# Patient Record
Sex: Male | Born: 1995 | Race: Black or African American | Hispanic: No | Marital: Single | Smoking: Never smoker
Health system: Southern US, Community
[De-identification: ages and names within clinical notes are randomized; demographics above are authoritative.]

## PROBLEM LIST (undated history)

## (undated) DIAGNOSIS — E119 Type 2 diabetes mellitus without complications: Secondary | ICD-10-CM

## (undated) DIAGNOSIS — E1110 Type 2 diabetes mellitus with ketoacidosis without coma: Secondary | ICD-10-CM

## (undated) HISTORY — PX: ADENOIDECTOMY: SUR15

---

## 2019-01-16 ENCOUNTER — Encounter (HOSPITAL_COMMUNITY): Payer: Self-pay

## 2019-01-16 ENCOUNTER — Other Ambulatory Visit: Payer: Self-pay

## 2019-01-16 ENCOUNTER — Emergency Department (HOSPITAL_COMMUNITY)

## 2019-01-16 ENCOUNTER — Emergency Department (HOSPITAL_COMMUNITY)
Admission: EM | Admit: 2019-01-16 | Discharge: 2019-01-16 | Disposition: A | Discharge status: Home / Self Care | Attending: Emergency Medicine | Admitting: Emergency Medicine

## 2019-01-16 DIAGNOSIS — E1165 Type 2 diabetes mellitus with hyperglycemia: Secondary | ICD-10-CM | POA: Diagnosis not present

## 2019-01-16 DIAGNOSIS — R109 Unspecified abdominal pain: Secondary | ICD-10-CM | POA: Diagnosis not present

## 2019-01-16 DIAGNOSIS — E1010 Type 1 diabetes mellitus with ketoacidosis without coma: Secondary | ICD-10-CM | POA: Diagnosis not present

## 2019-01-16 DIAGNOSIS — R0602 Shortness of breath: Secondary | ICD-10-CM | POA: Diagnosis not present

## 2019-01-16 DIAGNOSIS — R1110 Vomiting, unspecified: Secondary | ICD-10-CM | POA: Insufficient documentation

## 2019-01-16 DIAGNOSIS — Z20828 Contact with and (suspected) exposure to other viral communicable diseases: Secondary | ICD-10-CM | POA: Diagnosis not present

## 2019-01-16 DIAGNOSIS — R509 Fever, unspecified: Secondary | ICD-10-CM | POA: Diagnosis present

## 2019-01-16 DIAGNOSIS — F1210 Cannabis abuse, uncomplicated: Secondary | ICD-10-CM | POA: Insufficient documentation

## 2019-01-16 DIAGNOSIS — R5383 Other fatigue: Secondary | ICD-10-CM | POA: Diagnosis not present

## 2019-01-16 HISTORY — DX: Type 2 diabetes mellitus with ketoacidosis without coma: E11.10

## 2019-01-16 HISTORY — DX: Type 2 diabetes mellitus without complications: E11.9

## 2019-01-16 LAB — URINALYSIS, ROUTINE W REFLEX MICROSCOPIC
Bilirubin Urine: NEGATIVE
Glucose, UA: >=500 mg/dL — AB
Hgb urine dipstick: NEGATIVE
Ketones, ur: 20 mg/dL — AB
Leukocytes,Ua: NEGATIVE
Nitrite: NEGATIVE
Protein, ur: NEGATIVE mg/dL
Specific Gravity, Urine: 1.025 (ref 1.005–1.030)
pH: 6 (ref 5.0–8.0)

## 2019-01-16 LAB — CBC
HCT: 50.8 % (ref 39.0–52.0)
Hemoglobin: 18.2 g/dL — ABNORMAL HIGH (ref 13.0–17.0)
MCH: 31.8 pg (ref 26.0–34.0)
MCHC: 35.8 g/dL (ref 30.0–36.0)
MCV: 88.8 fL (ref 80.0–100.0)
Platelets: 328 10*3/uL (ref 150–400)
RBC: 5.72 MIL/uL (ref 4.22–5.81)
RDW: 11.7 % (ref 11.5–15.5)
WBC: 10.4 10*3/uL (ref 4.0–10.5)
nRBC: 0 % (ref 0.0–0.2)

## 2019-01-16 LAB — CBG MONITORING, ED
Glucose-Capillary: 491 mg/dL — ABNORMAL HIGH (ref 70–99)
Glucose-Capillary: 445 mg/dL — ABNORMAL HIGH (ref 70–99)
Glucose-Capillary: 247 mg/dL — ABNORMAL HIGH (ref 70–99)
Glucose-Capillary: 147 mg/dL — ABNORMAL HIGH (ref 70–99)

## 2019-01-16 LAB — BLOOD GAS, VENOUS
Acid-base deficit: 8.5 mmol/L — ABNORMAL HIGH (ref 0.0–2.0)
Bicarbonate: 17.6 mmol/L — ABNORMAL LOW (ref 20.0–28.0)
O2 Saturation: 42.3 %
Patient temperature: 98.6
pCO2, Ven: 39.1 mmHg — ABNORMAL LOW (ref 44.0–60.0)
pH, Ven: 7.277 (ref 7.250–7.430)

## 2019-01-16 LAB — SARS CORONAVIRUS 2 BY RT PCR (HOSPITAL ORDER, PERFORMED IN ~~LOC~~ HOSPITAL LAB): SARS Coronavirus 2: NEGATIVE

## 2019-01-16 LAB — BASIC METABOLIC PANEL
Anion gap: 10 (ref 5–15)
BUN: 15 mg/dL (ref 6–20)
CO2: 16 mmol/L — ABNORMAL LOW (ref 22–32)
Calcium: 8.3 mg/dL — ABNORMAL LOW (ref 8.9–10.3)
Chloride: 108 mmol/L (ref 98–111)
Creatinine, Ser: 0.83 mg/dL (ref 0.61–1.24)
GFR calc Af Amer: >60 mL/min (ref >60)
GFR calc non Af Amer: >60 mL/min (ref >60)
Glucose, Bld: 146 mg/dL — ABNORMAL HIGH (ref 70–99)
Potassium: 3.3 mmol/L — ABNORMAL LOW (ref 3.5–5.1)
Sodium: 134 mmol/L — ABNORMAL LOW (ref 135–145)

## 2019-01-16 MED ORDER — SODIUM CHLORIDE 0.9 % IV BOLUS
1000.0000 mL | Freq: Once | INTRAVENOUS | Status: AC
  Administered 2019-01-16: 1000 mL via INTRAVENOUS

## 2019-01-16 MED ORDER — INSULIN REGULAR(HUMAN) IN NACL 100-0.9 UT/100ML-% IV SOLN
INTRAVENOUS | Status: DC
  Administered 2019-01-16: 3.9 [IU]/h via INTRAVENOUS
  Filled 2019-01-16: qty 100

## 2019-01-16 MED ORDER — ONDANSETRON HCL 4 MG PO TABS: 4.0000 mg | ORAL_TABLET | Freq: Three times a day (TID) | ORAL | 0 refills | Status: DC | PRN

## 2019-01-16 MED ORDER — ONDANSETRON HCL 4 MG/2ML IJ SOLN
4.0000 mg | Freq: Once | INTRAMUSCULAR | Status: AC
  Administered 2019-01-16: 4 mg via INTRAVENOUS
  Filled 2019-01-16: qty 2

## 2019-01-16 MED ORDER — SODIUM CHLORIDE 0.9 % IV SOLN
INTRAVENOUS | Status: DC
  Administered 2019-01-16: 13:00:00 via INTRAVENOUS

## 2019-01-16 NOTE — ED Provider Notes (Signed)
[facility]-EMERGENCY DEPT Provider Note   CSN: [id number] Arrival date & time: 01/16/19  1121     History   Chief Complaint Chief Complaint  Patient presents with  . Fever  . Emesis  . Abdominal Pain  . Hyperglycemia    HPI Nathan Allen is a 23 y.o. male.     The history is provided by the patient and medical records. No language interpreter was used.  Fever Associated symptoms: vomiting   Emesis Associated symptoms: abdominal pain and fever   Abdominal Pain Associated symptoms: fever and vomiting   Hyperglycemia Associated symptoms: abdominal pain, fever and vomiting      23 year old male presenting with concern for DKA.  Patient is type I diabetic, for the past 3 to 4 days he endorsed persistent nausea, vomiting nonbloody nonbilious content, having generalized fatigue, subjective fever, as well as having shortness of breath when he walks.  Symptoms moderate in severity, he is unable to keep anything down and unable to use his diabetic medication.  He does not complain of any significant runny nose sneezing or coughing.  He denies body aches, or dysuria.  He denies any recent sick contact or any recent travel.  He has had DKA in the past requiring hospital admission.  Past Medical History:  Diagnosis Date  . Diabetes mellitus without complication (HCC)   . DKA (diabetic ketoacidoses) (HCC)     There are no active problems to display for this patient.   Past Surgical History:  Procedure Laterality Date  . ADENOIDECTOMY          Home Medications    Prior to Admission medications   Not on File    Family History Family History  Problem Relation Age of Onset  . Healthy Mother   . Healthy Father     Social History Social History   Tobacco Use  . Smoking status: Never Smoker  . Smokeless tobacco: Never Used  Substance Use Topics  . Alcohol use: Never    Frequency: Never  . Drug use: Yes    Types: Marijuana     Allergies    Patient has no known allergies.   Review of Systems Review of Systems  Constitutional: Positive for fever.  Gastrointestinal: Positive for abdominal pain and vomiting.  All other systems reviewed and are negative.    Physical Exam Updated Vital Signs BP 120/89 (BP Location: Right Arm)   Pulse 100   Temp 97.8 F (36.6 C) (Oral)   Resp 16   Ht 5\' 10"  (1.778 m)   Wt 54.4 kg   SpO2 100%   BMI 17.22 kg/m   Physical Exam Vitals signs and nursing note reviewed.  Constitutional:      General: He is not in acute distress.    Appearance: He is well-developed.  HENT:     Head: Atraumatic.  Eyes:     Conjunctiva/sclera: Conjunctivae normal.  Neck:     Musculoskeletal: Neck supple.  Cardiovascular:     Rate and Rhythm: Tachycardia present.     Heart sounds: Normal heart sounds.  Pulmonary:     Effort: Pulmonary effort is normal.     Breath sounds: Normal breath sounds. No wheezing, rhonchi or rales.  Abdominal:     General: Abdomen is flat. Bowel sounds are normal.     Palpations: Abdomen is soft.     Tenderness: There is no abdominal tenderness.  Skin:    Findings: No rash.  Neurological:     Mental  Status: He is alert and oriented to person, place, and time.  Psychiatric:        Mood and Affect: Mood normal.      ED Treatments / Results  Labs (all labs ordered are listed, but only abnormal results are displayed) Labs Reviewed  CBC - Abnormal; Notable for the following components:      Result Value   Hemoglobin 18.2 (*)    All other components within normal limits  URINALYSIS, ROUTINE W REFLEX MICROSCOPIC - Abnormal; Notable for the following components:   Color, Urine STRAW (*)    Glucose, UA >=500 (*)    Ketones, ur 20 (*)    Bacteria, UA RARE (*)    All other components within normal limits  BLOOD GAS, VENOUS - Abnormal; Notable for the following components:   pCO2, Ven 39.1 (*)    Bicarbonate 17.6 (*)    Acid-base deficit 8.5 (*)    All other  components within normal limits  BASIC METABOLIC PANEL - Abnormal; Notable for the following components:   Sodium 134 (*)    Potassium 3.3 (*)    CO2 16 (*)    Glucose, Bld 146 (*)    Calcium 8.3 (*)    All other components within normal limits  CBG MONITORING, ED - Abnormal; Notable for the following components:   Glucose-Capillary 491 (*)    All other components within normal limits  CBG MONITORING, ED - Abnormal; Notable for the following components:   Glucose-Capillary 445 (*)    All other components within normal limits  CBG MONITORING, ED - Abnormal; Notable for the following components:   Glucose-Capillary 247 (*)    All other components within normal limits  CBG MONITORING, ED - Abnormal; Notable for the following components:   Glucose-Capillary 147 (*)    All other components within normal limits  SARS CORONAVIRUS 2 (HOSPITAL ORDER, PERFORMED IN [facility])    EKG None  Radiology Dg Chest Portable 1 View  Result Date: 01/16/2019 CLINICAL DATA:  Fever, shortness of breath EXAM: PORTABLE CHEST 1 VIEW COMPARISON:  None. FINDINGS: The heart size and mediastinal contours are within normal limits. Both lungs are clear. The visualized skeletal structures are unremarkable. IMPRESSION: Normal chest x-ray. Electronically Signed   By: Duanne GuessNicholas  Plundo M.D.   On: 01/16/2019 13:38    Procedures .Critical Care Performed by: Fayrene Helperran, Delbert Vu, PA-C Authorized by: Fayrene Helperran, Derryl Uher, PA-C   Critical care provider statement:    Critical care time (minutes):  45   Critical care was time spent personally by me on the following activities:  Discussions with consultants, evaluation of patient's response to treatment, examination of patient, ordering and performing treatments and interventions, ordering and review of laboratory studies, ordering and review of radiographic studies, pulse oximetry, re-evaluation of patient's condition, obtaining history from patient or surrogate and review  of old charts   (including critical care time)  Medications Ordered in ED Medications  insulin regular, human (MYXREDLIN) 100 units/ 100 mL infusion (has no administration in time range)  sodium chloride 0.9 % bolus 1,000 mL (has no administration in time range)    And  sodium chloride 0.9 % bolus 1,000 mL (has no administration in time range)    And  0.9 %  sodium chloride infusion (has no administration in time range)  ondansetron (ZOFRAN) injection 4 mg (has no administration in time range)     Initial Impression / Assessment and Plan / ED Course  I have reviewed  the triage vital signs and the nursing notes.  Pertinent labs & imaging results that were available during my care of the patient were reviewed by me and considered in my medical decision making (see chart for details).        BP 106/80   Pulse (!) 114   Temp 97.8 F (36.6 C) (Oral)   Resp 18   Ht 5\' 10"  (1.778 m)   Wt 54.4 kg   SpO2 100%   BMI 17.22 kg/m    Final Clinical Impressions(s) / ED Diagnoses   Final diagnoses:  Diabetic ketoacidosis without coma associated with type 1 diabetes mellitus [facility])    ED Discharge Orders         Ordered    ondansetron (ZOFRAN) 4 MG tablet  Every 8 hours PRN     01/16/19 1734         12:21 PM Patient with history of type 1 diabetes here with persistent nausea vomiting and was found to be hyperglycemic with a CBG of 491.  Symptom concerning for DKA.  No COVID symptom however he does endorse fever and shortness of breath therefore will obtain portable chest x-ray as well as testing for COVID-19.  Work-up initiated.  Glucose stabilizer protocol initiated.  5:26 PM Unfortunately, BMP was obtained but it did not crossover therefore there was a delay in the result.  After patient received adequate fluid, and insulin, a BMP was subsequently obtained and did not shows any evidence of anion gap.  CBG now back to normal level, patient felt much better, able to eat and drink  and felt comfortable going home.  He does not have a fever here.  He did receive IV fluids for his tachycardia.  COVID-19 test is negative.  Patient is then to return promptly if his condition worsen   Domenic Moras, Hershal Coria 01/16/19 1734    Long, Wonda Olds, MD 01/16/19 [phone]

## 2019-01-16 NOTE — ED Triage Notes (Signed)
Patient c/o mild abdominal pain, emesis, fever, fatigue, SOB x 3 days.  Patient states any activity he feels tired and has SOB. Patient states he is type 1 diabetic and has not been able to eat due to N/V.

## 2019-05-08 ENCOUNTER — Other Ambulatory Visit: Payer: Self-pay

## 2019-05-08 ENCOUNTER — Encounter (HOSPITAL_COMMUNITY): Payer: Self-pay | Admitting: Emergency Medicine

## 2019-05-08 ENCOUNTER — Emergency Department (HOSPITAL_COMMUNITY)
Admission: EM | Admit: 2019-05-08 | Discharge: 2019-05-08 | Disposition: A | Discharge status: Home / Self Care | Attending: Emergency Medicine | Admitting: Emergency Medicine

## 2019-05-08 DIAGNOSIS — Z794 Long term (current) use of insulin: Secondary | ICD-10-CM | POA: Insufficient documentation

## 2019-05-08 DIAGNOSIS — E119 Type 2 diabetes mellitus without complications: Secondary | ICD-10-CM | POA: Insufficient documentation

## 2019-05-08 DIAGNOSIS — Z5321 Procedure and treatment not carried out due to patient leaving prior to being seen by health care provider: Secondary | ICD-10-CM | POA: Insufficient documentation

## 2019-05-08 LAB — CBC
HCT: 52.8 % — ABNORMAL HIGH (ref 39.0–52.0)
Hemoglobin: 18.3 g/dL — ABNORMAL HIGH (ref 13.0–17.0)
MCH: 32.3 pg (ref 26.0–34.0)
MCHC: 34.7 g/dL (ref 30.0–36.0)
MCV: 93.1 fL (ref 80.0–100.0)
Platelets: 385 10*3/uL (ref 150–400)
RBC: 5.67 MIL/uL (ref 4.22–5.81)
RDW: 11.4 % — ABNORMAL LOW (ref 11.5–15.5)
WBC: 23.5 10*3/uL — ABNORMAL HIGH (ref 4.0–10.5)
nRBC: 0 % (ref 0.0–0.2)

## 2019-05-08 LAB — COMPREHENSIVE METABOLIC PANEL
ALT: 22 U/L (ref 0–44)
AST: 29 U/L (ref 15–41)
Albumin: 5.2 g/dL — ABNORMAL HIGH (ref 3.5–5.0)
Alkaline Phosphatase: 65 U/L (ref 38–126)
Anion gap: 21 — ABNORMAL HIGH (ref 5–15)
BUN: 17 mg/dL (ref 6–20)
CO2: 10 mmol/L — ABNORMAL LOW (ref 22–32)
Calcium: 10.2 mg/dL (ref 8.9–10.3)
Chloride: 105 mmol/L (ref 98–111)
Creatinine, Ser: 1.71 mg/dL — ABNORMAL HIGH (ref 0.61–1.24)
GFR calc Af Amer: >60 mL/min (ref >60)
GFR calc non Af Amer: 56 mL/min — ABNORMAL LOW (ref >60)
Glucose, Bld: 163 mg/dL — ABNORMAL HIGH (ref 70–99)
Potassium: 4.6 mmol/L (ref 3.5–5.1)
Sodium: 136 mmol/L (ref 135–145)
Total Bilirubin: 1.8 mg/dL — ABNORMAL HIGH (ref 0.3–1.2)
Total Protein: 8.2 g/dL — ABNORMAL HIGH (ref 6.5–8.1)

## 2019-05-08 LAB — LIPASE, BLOOD: Lipase: 48 U/L (ref 11–51)

## 2019-05-08 LAB — CBG MONITORING, ED: Glucose-Capillary: 149 mg/dL — ABNORMAL HIGH (ref 70–99)

## 2019-05-08 MED ORDER — SODIUM CHLORIDE 0.9% FLUSH: 3.0000 mL | Freq: Once | INTRAVENOUS | Status: DC

## 2019-05-08 NOTE — ED Notes (Signed)
Pt called to recheck vitals. No response.  

## 2019-05-08 NOTE — ED Notes (Signed)
Last call for pt. No response and pt is not seen in the waiting room anymore.

## 2019-05-08 NOTE — ED Notes (Signed)
2x calling pt to recheck vitals. No response.  

## 2019-05-08 NOTE — ED Triage Notes (Signed)
Pt reports not being able to keep any food down, only water since yesterday. Believes it is a stomach bug or DKA. Reports taking insulin today.

## 2019-07-16 ENCOUNTER — Inpatient Hospital Stay (HOSPITAL_COMMUNITY)
Admission: EM | Admit: 2019-07-16 | Discharge: 2019-07-20 | DRG: 638 | Disposition: A | Discharge status: Home / Self Care | Attending: Internal Medicine | Admitting: Internal Medicine

## 2019-07-16 ENCOUNTER — Encounter (HOSPITAL_COMMUNITY): Payer: Self-pay

## 2019-07-16 ENCOUNTER — Other Ambulatory Visit: Payer: Self-pay

## 2019-07-16 ENCOUNTER — Emergency Department (HOSPITAL_COMMUNITY): Payer: Self-pay

## 2019-07-16 DIAGNOSIS — E1110 Type 2 diabetes mellitus with ketoacidosis without coma: Secondary | ICD-10-CM | POA: Diagnosis present

## 2019-07-16 DIAGNOSIS — Z91138 Patient's unintentional underdosing of medication regimen for other reason: Secondary | ICD-10-CM

## 2019-07-16 DIAGNOSIS — E871 Hypo-osmolality and hyponatremia: Secondary | ICD-10-CM | POA: Diagnosis present

## 2019-07-16 DIAGNOSIS — Z794 Long term (current) use of insulin: Secondary | ICD-10-CM

## 2019-07-16 DIAGNOSIS — R17 Unspecified jaundice: Secondary | ICD-10-CM | POA: Diagnosis present

## 2019-07-16 DIAGNOSIS — E860 Dehydration: Secondary | ICD-10-CM | POA: Diagnosis present

## 2019-07-16 DIAGNOSIS — E1010 Type 1 diabetes mellitus with ketoacidosis without coma: Secondary | ICD-10-CM | POA: Diagnosis not present

## 2019-07-16 DIAGNOSIS — D72825 Bandemia: Secondary | ICD-10-CM | POA: Diagnosis not present

## 2019-07-16 DIAGNOSIS — E876 Hypokalemia: Secondary | ICD-10-CM

## 2019-07-16 DIAGNOSIS — T383X6A Underdosing of insulin and oral hypoglycemic [antidiabetic] drugs, initial encounter: Secondary | ICD-10-CM | POA: Diagnosis not present

## 2019-07-16 DIAGNOSIS — Z20822 Contact with and (suspected) exposure to covid-19: Secondary | ICD-10-CM | POA: Diagnosis present

## 2019-07-16 DIAGNOSIS — D72829 Elevated white blood cell count, unspecified: Secondary | ICD-10-CM | POA: Diagnosis present

## 2019-07-16 DIAGNOSIS — N179 Acute kidney failure, unspecified: Secondary | ICD-10-CM | POA: Diagnosis present

## 2019-07-16 LAB — CBC WITH DIFFERENTIAL/PLATELET
Abs Immature Granulocytes: 0.52 10*3/uL — ABNORMAL HIGH (ref 0.00–0.07)
Basophils Absolute: 0.2 10*3/uL — ABNORMAL HIGH (ref 0.0–0.1)
Basophils Relative: 1 %
Eosinophils Absolute: 0 10*3/uL (ref 0.0–0.5)
Eosinophils Relative: 0 %
HCT: 52.6 % — ABNORMAL HIGH (ref 39.0–52.0)
Hemoglobin: 18.1 g/dL — ABNORMAL HIGH (ref 13.0–17.0)
Immature Granulocytes: 2 %
Lymphocytes Relative: 7 %
Lymphs Abs: 2.1 10*3/uL (ref 0.7–4.0)
MCH: 32 pg (ref 26.0–34.0)
MCHC: 34.4 g/dL (ref 30.0–36.0)
MCV: 93.1 fL (ref 80.0–100.0)
Monocytes Absolute: 3.2 10*3/uL — ABNORMAL HIGH (ref 0.1–1.0)
Monocytes Relative: 10 %
Neutro Abs: 25.9 10*3/uL — ABNORMAL HIGH (ref 1.7–7.7)
Neutrophils Relative %: 80 %
Platelets: 383 10*3/uL (ref 150–400)
RBC: 5.65 MIL/uL (ref 4.22–5.81)
RDW: 11.8 % (ref 11.5–15.5)
WBC: 31.9 10*3/uL — ABNORMAL HIGH (ref 4.0–10.5)
nRBC: 0 % (ref 0.0–0.2)

## 2019-07-16 LAB — BLOOD GAS, VENOUS
Bicarbonate: 4.7 mmol/L — ABNORMAL LOW (ref 20.0–28.0)
O2 Saturation: 86.1 %
Patient temperature: 98.6
pCO2, Ven: 24.8 mmHg — ABNORMAL LOW (ref 44.0–60.0)
pH, Ven: 6.91 — CL (ref 7.250–7.430)
pO2, Ven: 69.3 mmHg — ABNORMAL HIGH (ref 32.0–45.0)

## 2019-07-16 LAB — CBG MONITORING, ED
Glucose-Capillary: 420 mg/dL — ABNORMAL HIGH (ref 70–99)
Glucose-Capillary: 483 mg/dL — ABNORMAL HIGH (ref 70–99)
Glucose-Capillary: 369 mg/dL — ABNORMAL HIGH (ref 70–99)

## 2019-07-16 LAB — LACTIC ACID, PLASMA: Lactic Acid, Venous: 2.8 mmol/L (ref 0.5–1.9)

## 2019-07-16 LAB — COMPREHENSIVE METABOLIC PANEL
ALT: 21 U/L (ref 0–44)
AST: 15 U/L (ref 15–41)
Albumin: 5.4 g/dL — ABNORMAL HIGH (ref 3.5–5.0)
Alkaline Phosphatase: 89 U/L (ref 38–126)
BUN: 23 mg/dL — ABNORMAL HIGH (ref 6–20)
CO2: <7 mmol/L — ABNORMAL LOW (ref 22–32)
Calcium: 9.3 mg/dL (ref 8.9–10.3)
Chloride: 93 mmol/L — ABNORMAL LOW (ref 98–111)
Creatinine, Ser: 1.48 mg/dL — ABNORMAL HIGH (ref 0.61–1.24)
GFR calc Af Amer: >60 mL/min (ref >60)
GFR calc non Af Amer: >60 mL/min (ref >60)
Glucose, Bld: 462 mg/dL — ABNORMAL HIGH (ref 70–99)
Potassium: 4.4 mmol/L (ref 3.5–5.1)
Sodium: 127 mmol/L — ABNORMAL LOW (ref 135–145)
Total Bilirubin: 2.3 mg/dL — ABNORMAL HIGH (ref 0.3–1.2)
Total Protein: 9 g/dL — ABNORMAL HIGH (ref 6.5–8.1)

## 2019-07-16 LAB — BETA-HYDROXYBUTYRIC ACID: Beta-Hydroxybutyric Acid: >8 mmol/L — ABNORMAL HIGH (ref 0.05–0.27)

## 2019-07-16 LAB — RESPIRATORY PANEL BY RT PCR (FLU A&B, COVID)
Influenza A by PCR: NEGATIVE
Influenza B by PCR: NEGATIVE
SARS Coronavirus 2 by RT PCR: NEGATIVE

## 2019-07-16 MED ORDER — SODIUM BICARBONATE 8.4 % IV SOLN: 50.0000 meq | Freq: Once | INTRAVENOUS | Status: AC

## 2019-07-16 MED ORDER — SODIUM BICARBONATE 8.4 % IV SOLN
INTRAVENOUS | Status: AC
  Administered 2019-07-16: 50 meq via INTRAVENOUS
  Filled 2019-07-16: qty 50

## 2019-07-16 MED ORDER — INSULIN REGULAR(HUMAN) IN NACL 100-0.9 UT/100ML-% IV SOLN
INTRAVENOUS | Status: DC
  Administered 2019-07-16: 22:00:00 6.5 [IU]/h via INTRAVENOUS
  Filled 2019-07-16: qty 100

## 2019-07-16 MED ORDER — SODIUM CHLORIDE 0.9 % IV BOLUS
1000.0000 mL | Freq: Once | INTRAVENOUS | Status: AC
  Administered 2019-07-16: 22:00:00 1000 mL via INTRAVENOUS

## 2019-07-16 MED ORDER — SODIUM CHLORIDE 0.9 % IV BOLUS
1000.0000 mL | Freq: Once | INTRAVENOUS | Status: AC
  Administered 2019-07-16: 1000 mL via INTRAVENOUS

## 2019-07-16 MED ORDER — DEXTROSE-NACL 5-0.45 % IV SOLN: INTRAVENOUS | Status: DC

## 2019-07-16 MED ORDER — POTASSIUM CHLORIDE 10 MEQ/100ML IV SOLN
10.0000 meq | INTRAVENOUS | Status: AC
  Administered 2019-07-16 (×2): 10 meq via INTRAVENOUS
  Filled 2019-07-16 (×2): qty 100

## 2019-07-16 MED ORDER — DEXTROSE 50 % IV SOLN: 0.0000 mL | INTRAVENOUS | Status: DC | PRN

## 2019-07-16 MED ORDER — SODIUM CHLORIDE 0.9 % IV SOLN: INTRAVENOUS | Status: DC

## 2019-07-16 NOTE — ED Notes (Signed)
Date and time results received: 07/16/19 11:46 PM  (use smartphrase ".now" to insert current time)  Test: Lactic Acid Critical Value: 2.8  Name of Provider Notified: Mikeal Hawthorne  Orders Received? Or Actions Taken?: Actions Taken: MD notified

## 2019-07-16 NOTE — ED Provider Notes (Signed)
[facility]-EMERGENCY DEPT Provider Note   CSN: [id number] Arrival date & time: 07/16/19  1732     History Chief Complaint  Patient presents with  . Fever  . Cough  . covid exposure    Nathan Allen is a 24 y.o. male with PMHx Type I diabetes who presents to the ED today complaining of subjective fevers, body aches, and shortness of breath x 3 days. Per triage report pt reports he was exposed to COVID 3 days ago.  Reports that an individual from work recently tested positive him he was around prior to feeling bad.  Triage report patient's mother had called the ED regarding the fact the patient is diabetic and he has not been eating for 3 days and not taking his insulin.  CBG was checked which was found to be 420.  Patient states he has not had much of an appetite or energy to take his insulin for the past several days.  He does have history of DKA and states that this could feel similar he is unsure as he is concerned that he feels short of breath due to Covid exposure.  She denies coughing although per triage report he had initially stated that he had been coughing.  Patient states that anytime he tries to eat anything he will vomited back up.  He states that he has tried to stay hydrated with plenty of fluids but does not think he has been very successful.  The history is provided by the patient.       Past Medical History:  Diagnosis Date  . Diabetes mellitus without complication (HCC)   . DKA (diabetic ketoacidoses) [facility])     Patient Active Problem List   Diagnosis Date Noted  . DKA (diabetic ketoacidoses) (HCC) 07/16/2019  . Suspected COVID-19 virus infection 07/16/2019  . Hyponatremia 07/16/2019  . ARF (acute renal failure) (HCC) 07/16/2019  . Leukocytosis 07/16/2019    Past Surgical History:  Procedure Laterality Date  . ADENOIDECTOMY         Family History  Problem Relation Age of Onset  . Healthy Mother   . Healthy Father     Social  History   Tobacco Use  . Smoking status: Never Smoker  . Smokeless tobacco: Never Used  Substance Use Topics  . Alcohol use: Never  . Drug use: Yes    Types: Marijuana    Home Medications Prior to Admission medications   Medication Sig Start Date End Date Taking? Authorizing Provider  insulin aspart (NOVOLOG) 100 UNIT/ML injection Inject 6-23 Units into the skin 3 (three) times daily before meals. Per sliding scale   Yes [provider]  insulin glargine (LANTUS) 100 UNIT/ML injection Inject 23 Units into the skin at bedtime.   Yes [provider]  ondansetron (ZOFRAN) 4 MG tablet Take 1 tablet (4 mg total) by mouth every 8 (eight) hours as needed for nausea or vomiting. Patient not taking: Reported on 07/16/2019 01/16/19   Fayrene Helper, PA-C    Allergies    Patient has no known allergies.  Review of Systems   Review of Systems  Constitutional: Positive for chills, fatigue and fever (subjective).  Respiratory: Positive for shortness of breath. Negative for cough.   Gastrointestinal: Positive for nausea and vomiting.  Musculoskeletal: Positive for myalgias.  All other systems reviewed and are negative.   Physical Exam Updated Vital Signs BP (!) 152/95   Pulse (!) 126   Temp 97.6 F (36.4 C)   Resp (!)  24   Ht 5\' 10"  (1.778 m)   Wt 54.4 kg   SpO2 99%   BMI 17.22 kg/m   Physical Exam Vitals and nursing note reviewed.  Constitutional:      Appearance: He is not ill-appearing or diaphoretic.  HENT:     Head: Normocephalic and atraumatic.  Eyes:     Conjunctiva/sclera: Conjunctivae normal.  Cardiovascular:     Rate and Rhythm: Regular rhythm. Tachycardia present.  Pulmonary:     Effort: Tachypnea present.     Breath sounds: Normal breath sounds. No wheezing, rhonchi or rales.  Abdominal:     Palpations: Abdomen is soft.     Tenderness: There is no abdominal tenderness. There is no guarding or rebound.  Musculoskeletal:     Cervical back: Neck  supple.  Skin:    General: Skin is warm and dry.  Neurological:     Mental Status: He is alert.     ED Results / Procedures / Treatments   Labs (all labs ordered are listed, but only abnormal results are displayed) Labs Reviewed  COMPREHENSIVE METABOLIC PANEL - Abnormal; Notable for the following components:      Result Value   Sodium 127 (*)    Chloride 93 (*)    CO2 <7 (*)    Glucose, Bld 462 (*)    BUN 23 (*)    Creatinine, Ser 1.48 (*)    Total Protein 9.0 (*)    Albumin 5.4 (*)    Total Bilirubin 2.3 (*)    All other components within normal limits  CBC WITH DIFFERENTIAL/PLATELET - Abnormal; Notable for the following components:   WBC 31.9 (*)    Hemoglobin 18.1 (*)    HCT 52.6 (*)    Neutro Abs 25.9 (*)    Monocytes Absolute 3.2 (*)    Basophils Absolute 0.2 (*)    Abs Immature Granulocytes 0.52 (*)    All other components within normal limits  BETA-HYDROXYBUTYRIC ACID - Abnormal; Notable for the following components:   Beta-Hydroxybutyric Acid >8.00 (*)    All other components within normal limits  BLOOD GAS, VENOUS - Abnormal; Notable for the following components:   pH, Ven 6.910 (*)    pCO2, Ven 24.8 (*)    pO2, Ven 69.3 (*)    Bicarbonate 4.7 (*)    All other components within normal limits  CBG MONITORING, ED - Abnormal; Notable for the following components:   Glucose-Capillary 420 (*)    All other components within normal limits  CBG MONITORING, ED - Abnormal; Notable for the following components:   Glucose-Capillary 483 (*)    All other components within normal limits  CBG MONITORING, ED - Abnormal; Notable for the following components:   Glucose-Capillary 369 (*)    All other components within normal limits  CULTURE, BLOOD (ROUTINE X 2)  CULTURE, BLOOD (ROUTINE X 2)  RESPIRATORY PANEL BY RT PCR (FLU A&B, COVID)  LACTIC ACID, PLASMA  LACTIC ACID, PLASMA  URINALYSIS, ROUTINE W REFLEX MICROSCOPIC    EKG None  Radiology DG Chest Port 1  View  Result Date: 07/16/2019 CLINICAL DATA:  Cough, shortness of breath. EXAM: PORTABLE CHEST 1 VIEW COMPARISON:  01/16/2019 FINDINGS: The heart size and mediastinal contours are within normal limits. Both lungs are clear. The visualized skeletal structures are unremarkable. IMPRESSION: Negative. Electronically Signed   By: Rolm Baptise M.D.   On: 07/16/2019 20:15    Procedures .Critical Care Performed by: Eustaquio Maize, PA-C Authorized by: Eustaquio Maize,  PA-C   Critical care provider statement:    Critical care time (minutes):  45   Critical care was necessary to treat or prevent imminent or life-threatening deterioration of the following conditions:  Endocrine crisis   Critical care was time spent personally by me on the following activities:  Discussions with consultants, evaluation of patient's response to treatment, examination of patient, ordering and performing treatments and interventions, ordering and review of laboratory studies, ordering and review of radiographic studies, pulse oximetry, re-evaluation of patient's condition, obtaining history from patient or surrogate and review of old charts   (including critical care time)  Medications Ordered in ED Medications  insulin regular, human (MYXREDLIN) 100 units/ 100 mL infusion (6.5 Units/hr Intravenous New Bag/Given 07/16/19 2158)  0.9 %  sodium chloride infusion (has no administration in time range)  dextrose 5 %-0.45 % sodium chloride infusion (has no administration in time range)  dextrose 50 % solution 0-50 mL (has no administration in time range)  potassium chloride 10 mEq in 100 mL IVPB (10 mEq Intravenous New Bag/Given 07/16/19 2301)  sodium chloride 0.9 % bolus 1,000 mL (1,000 mLs Intravenous New Bag/Given 07/16/19 2154)  sodium bicarbonate injection 50 mEq (50 mEq Intravenous Given 07/16/19 2245)  sodium chloride 0.9 % bolus 1,000 mL (1,000 mLs Intravenous New Bag/Given 07/16/19 2246)    ED Course  I have reviewed the  triage vital signs and the nursing notes.  Pertinent labs & imaging results that were available during my care of the patient were reviewed by me and considered in my medical decision making (see chart for details).  24 year old male who presents the ED today complaining of Covid-like symptoms.  States that he was recently exposed to an individual, this is because patient did not take his insulin for the past 3 days.  On arrival patient CBG in the 400s.  On my exam patient is an afebrile longer he is tachycardic in the 120s and tachypneic.  Appears to have kussmaul respirations at this time.  I am very suspicious for DKA.  Patient does have a history of DKA in the past.  Given recent COVID-19 positive exposure will obtain Covid test at this time.  Suspect patient will need to be admitted.  CBC with leukocytosis 31,000.  Discussed this with attending physician Dr. Jeraldine Loots who suggest this may be an acute phase reactant.  Patient without any complaints including urinary, abdominal pain.  Chest x-ray is clear.  CMP with sodium 127 although with correction 133.  His glucose was 462, bicarb is less than 7.  Creatinine 1.48.  Anion gap not calculated although per my calculation somewhere in the 30s.  Beta hydroxybutyrate greater than 8.  Still awaiting VBG at this time although very suspicious for DKA.  Will start on Endo tool.   BG with pH of 6.91.  Will start patient on 1 amp of bicarb.  Will consult for admission at this time.  Still pending Covid test.   Clinical Course as of Jul 16 2315  Wed Jul 16, 2019  2225 pH, Peter Minium(!!): 6.910 [MV]  2251 Discussed case with Dr. Mikeal Hawthorne who agrees to accept patient for admission   [MV]    Clinical Course User Index [MV] Tanda Rockers, PA-C   This note was prepared using Dragon voice recognition software and may include unintentional dictation errors due to the inherent limitations of voice recognition software.  Nathan Allen was evaluated in Emergency  Department on 07/16/2019 for the symptoms described in the history of  present illness. He was evaluated in the context of the global COVID-19 pandemic, which necessitated consideration that the patient might be at risk for infection with the SARS-CoV-2 virus that causes COVID-19. Institutional protocols and algorithms that pertain to the evaluation of patients at risk for COVID-19 are in a state of rapid change based on information released by regulatory bodies including the CDC and federal and state organizations. These policies and algorithms were followed during the patient's care in the ED.  MDM Rules/Calculators/A&P                       Final Clinical Impression(s) / ED Diagnoses Final diagnoses:  Diabetic ketoacidosis without coma associated with type 1 diabetes mellitus (HCC)  Person under investigation for COVID-19    Rx / DC Orders ED Discharge Orders    None       Tanda Rockers, PA-C 07/16/19 2318    Gerhard Munch, MD 07/16/19 [phone]

## 2019-07-16 NOTE — ED Triage Notes (Addendum)
Patient c/o fever, cough, body aches, and SOB x 3 days. Paitent states he was exposed to Covid 3 days ago.  Patient is diabetic. Patient's mother called the ED c/o about patient being diabetic.Patient 's CBG-420. Patient did not mention in triage that he had not eaten x 3 days and has not had his insulin in 3 days until CBG checked by Triage NT in the lobby.

## 2019-07-16 NOTE — H&P (Signed)
History and Physical   Nathan Allen WCH:[id number] DOB: Oct 22, 1995 DOA: 07/16/2019  Referring MD/NP/PA: Dr. Jeraldine Loots  PCP: Patient, No Pcp Per   Outpatient Specialists: None  Patient coming from: Home  Chief Complaint: Generalized weakness and fever  HPI: Nathan Allen is a 24 y.o. male with medical history significant of type 1 diabetes who is supposed to be on insulin that has not taken his insulin in the last few days secondary to falling sick.  Patient had Covid exposure and started getting sick with nausea vomiting abdominal pain and fever a few days ago.  He also has shortness of breath and body aches in the last 3 days.  He has not eaten for 3 days and her and has been reading on use insulin within the last 3 days.  In the ER patient was found to have blood sugar more than 400 elevated anion gap and ketonuria.  He is being admitted with diabetic ketoacidosis.  He also has symptoms suggestive of possible COVID-19 and he is a person under investigation at this point.  Test results pending..  ED Course: Temperature is 97.5 blood pressure 153/93 pulse 126 respirate of 26 oxygen sats 100% on room air.  White count 31.9 hemoglobin 18.1 and platelet count of 383.  Sodium 127 potassium 4.4 chloride 93 CO2 of less than 7 BUN 23 creatinine 1.48 and calcium 9.3.  ABG showed a pH of 6.910.  Anion gap is so high that was not calculated.  Patient is being admitted with diabetic ketoacidosis after receiving fluid boluses.  Review of Systems: As per HPI otherwise 10 point review of systems negative.    Past Medical History:  Diagnosis Date  . Diabetes mellitus without complication (HCC)   . DKA (diabetic ketoacidoses) [facility])     Past Surgical History:  Procedure Laterality Date  . ADENOIDECTOMY       reports that he has never smoked. He has never used smokeless tobacco. He reports current drug use. Drug: Marijuana. He reports that he does not drink alcohol.  No Known Allergies  Family  History  Problem Relation Age of Onset  . Healthy Mother   . Healthy Father      Prior to Admission medications   Medication Sig Start Date End Date Taking? Authorizing Provider  insulin aspart (NOVOLOG) 100 UNIT/ML injection Inject 6-23 Units into the skin 3 (three) times daily before meals. Per sliding scale   Yes [provider]  insulin glargine (LANTUS) 100 UNIT/ML injection Inject 23 Units into the skin at bedtime.   Yes [provider]  ondansetron (ZOFRAN) 4 MG tablet Take 1 tablet (4 mg total) by mouth every 8 (eight) hours as needed for nausea or vomiting. Patient not taking: Reported on 07/16/2019 01/16/19   Fayrene Helper, PA-C    Physical Exam: Vitals:   07/16/19 1930 07/16/19 2145 07/16/19 2200 07/16/19 2230  BP: 138/83 126/89 (!) 145/95 (!) 147/85  Pulse: (!) 105 (!) 104 (!) 115 (!) 121  Resp:   (!) 25 (!) 26  Temp:      TempSrc:      SpO2: 100% 100% 100% 100%  Weight:      Height:          Constitutional: Acutely ill looking and anxious Vitals:   07/16/19 1930 07/16/19 2145 07/16/19 2200 07/16/19 2230  BP: 138/83 126/89 (!) 145/95 (!) 147/85  Pulse: (!) 105 (!) 104 (!) 115 (!) 121  Resp:   (!) 25 (!) 26  Temp:  TempSrc:      SpO2: 100% 100% 100% 100%  Weight:      Height:       Eyes: PERRL, lids and conjunctivae normal ENMT: Mucous membranes are dry. Posterior pharynx clear of any exudate or lesions.Normal dentition.  Neck: normal, supple, no masses, no thyromegaly Respiratory: clear to auscultation bilaterally, no wheezing, no crackles. Normal respiratory effort. No accessory muscle use.  Cardiovascular: Sinus tachycardic, no murmurs / rubs / gallops. No extremity edema. 2+ pedal pulses. No carotid bruits.  Abdomen: no tenderness, no masses palpated. No hepatosplenomegaly. Bowel sounds positive.  Musculoskeletal: no clubbing / cyanosis. No joint deformity upper and lower extremities. Good ROM, no contractures. Normal muscle tone.    Skin: no rashes, lesions, ulcers. No induration Neurologic: CN 2-12 grossly intact. Sensation intact, DTR normal. Strength 5/5 in all 4.  Psychiatric: Normal judgment and insight. Alert and oriented x 3. Normal mood.     Labs on Admission: I have personally reviewed following labs and imaging studies  CBC: Recent Labs  Lab 07/16/19 1909  WBC 31.9*  NEUTROABS 25.9*  HGB 18.1*  HCT 52.6*  MCV 93.1  PLT 383   Basic Metabolic Panel: Recent Labs  Lab 07/16/19 1909  NA 127*  K 4.4  CL 93*  CO2 <7*  GLUCOSE 462*  BUN 23*  CREATININE 1.48*  CALCIUM 9.3   GFR: Estimated Creatinine Clearance: 60.8 mL/min (A) (by C-G formula based on SCr of 1.48 mg/dL (H)). Liver Function Tests: Recent Labs  Lab 07/16/19 1909  AST 15  ALT 21  ALKPHOS 89  BILITOT 2.3*  PROT 9.0*  ALBUMIN 5.4*   No results for input(s): LIPASE, AMYLASE in the last 168 hours. No results for input(s): AMMONIA in the last 168 hours. Coagulation Profile: No results for input(s): INR, PROTIME in the last 168 hours. Cardiac Enzymes: No results for input(s): CKTOTAL, CKMB, CKMBINDEX, TROPONINI in the last 168 hours. BNP (last 3 results) No results for input(s): PROBNP in the last 8760 hours. HbA1C: No results for input(s): HGBA1C in the last 72 hours. CBG: Recent Labs  Lab 07/16/19 1834 07/16/19 2151 07/16/19 2251  GLUCAP 420* 483* 369*   Lipid Profile: No results for input(s): CHOL, HDL, LDLCALC, TRIG, CHOLHDL, LDLDIRECT in the last 72 hours. Thyroid Function Tests: No results for input(s): TSH, T4TOTAL, FREET4, T3FREE, THYROIDAB in the last 72 hours. Anemia Panel: No results for input(s): VITAMINB12, FOLATE, FERRITIN, TIBC, IRON, RETICCTPCT in the last 72 hours. Urine analysis:    Component Value Date/Time   COLORURINE STRAW (A) 01/16/2019 1310   APPEARANCEUR CLEAR 01/16/2019 1310   LABSPEC 1.025 01/16/2019 1310   PHURINE 6.0 01/16/2019 1310   GLUCOSEU >=500 (A) 01/16/2019 1310   HGBUR  NEGATIVE 01/16/2019 1310   BILIRUBINUR NEGATIVE 01/16/2019 1310   KETONESUR 20 (A) 01/16/2019 1310   PROTEINUR NEGATIVE 01/16/2019 1310   NITRITE NEGATIVE 01/16/2019 1310   LEUKOCYTESUR NEGATIVE 01/16/2019 1310   Sepsis Labs: @LABRCNTIP (procalcitonin:4,lacticidven:4) )No results found for this or any previous visit (from the past 240 hour(s)).   Radiological Exams on Admission: DG Chest Port 1 View  Result Date: 07/16/2019 CLINICAL DATA:  Cough, shortness of breath. EXAM: PORTABLE CHEST 1 VIEW COMPARISON:  01/16/2019 FINDINGS: The heart size and mediastinal contours are within normal limits. Both lungs are clear. The visualized skeletal structures are unremarkable. IMPRESSION: Negative. Electronically Signed   By: 01/18/2019 M.D.   On: 07/16/2019 20:15    EKG: Independently reviewed.  It shows sinus tachycardia  with a rate of 120.  No significant ST changes  Assessment/Plan Principal Problem:   DKA (diabetic ketoacidoses) (HCC) Active Problems:   Suspected COVID-19 virus infection   Hyponatremia   ARF (acute renal failure) (HCC)   Leukocytosis     #1 diabetic ketoacidosis: Patient will be admitted to the intensive care unit.  He will be on stepdown status.  Start IV insulin per protocol.  IV fluids as well as serial blood glucose check hourly.  BMP every 4 hours.  Once gap is closed transition to his home regimen.  #2 possible COVID-19 infection: Patient has positive exposure and became sick.  Will follow results.  If confirmed he will be treated promptly.  At this point no evidence of hypoxemia.  #3 nausea vomiting and abdominal pain: Could be secondary to DKA.  It could also be GI manifestation of COVID-19 infection.  Symptomatic treatment  #4 acute kidney injury: Most likely prerenal due to dehydration.  Hydrate and monitor.  #5 leukocytosis: Probably secondary to DKA and dehydration.  Also possible infection.  Continue to monitor  #6 pseudohyponatremia: Secondary to high  glucose but also dehydration may be contributing.  Continue to monitor   DVT prophylaxis: Lovenox Code Status: Full code Family Communication: Mother over the phone Disposition Plan: Home Consults called: None Admission status: Inpatient to stepdown  Severity of Illness: The appropriate patient status for this patient is INPATIENT. Inpatient status is judged to be reasonable and necessary in order to provide the required intensity of service to ensure the patient's safety. The patient's presenting symptoms, physical exam findings, and initial radiographic and laboratory data in the context of their chronic comorbidities is felt to place them at high risk for further clinical deterioration. Furthermore, it is not anticipated that the patient will be medically stable for discharge from the hospital within 2 midnights of admission. The following factors support the patient status of inpatient.   " The patient's presenting symptoms include nausea vomiting and body aches. " The worrisome physical exam findings include dry mucous membranes. " The initial radiographic and laboratory data are worrisome because of evidence of DKA. " The chronic co-morbidities include type 1 diabetes.   * I certify that at the point of admission it is my clinical judgment that the patient will require inpatient hospital care spanning beyond 2 midnights from the point of admission due to high intensity of service, high risk for further deterioration and high frequency of surveillance required.Barbette Merino MD [facility] Pager [phone]  If 7PM-7AM, please contact night-coverage [email or web address] Password [facility]  07/16/2019, 10:53 PM

## 2019-07-17 DIAGNOSIS — D72825 Bandemia: Secondary | ICD-10-CM | POA: Diagnosis not present

## 2019-07-17 DIAGNOSIS — N179 Acute kidney failure, unspecified: Secondary | ICD-10-CM | POA: Diagnosis not present

## 2019-07-17 DIAGNOSIS — E1010 Type 1 diabetes mellitus with ketoacidosis without coma: Secondary | ICD-10-CM | POA: Diagnosis not present

## 2019-07-17 LAB — URINALYSIS, ROUTINE W REFLEX MICROSCOPIC
Bilirubin Urine: NEGATIVE
Glucose, UA: >=500 mg/dL — AB
Ketones, ur: 80 mg/dL — AB
Leukocytes,Ua: NEGATIVE
Nitrite: NEGATIVE
Protein, ur: 100 mg/dL — AB
Specific Gravity, Urine: 1.021 (ref 1.005–1.030)
pH: 5 (ref 5.0–8.0)

## 2019-07-17 LAB — BASIC METABOLIC PANEL
Anion gap: 18 — ABNORMAL HIGH (ref 5–15)
Anion gap: 14 (ref 5–15)
Anion gap: 13 (ref 5–15)
BUN: 21 mg/dL — ABNORMAL HIGH (ref 6–20)
BUN: 18 mg/dL (ref 6–20)
BUN: 16 mg/dL (ref 6–20)
CO2: 7 mmol/L — ABNORMAL LOW (ref 22–32)
CO2: 10 mmol/L — ABNORMAL LOW (ref 22–32)
CO2: 17 mmol/L — ABNORMAL LOW (ref 22–32)
Calcium: 8.4 mg/dL — ABNORMAL LOW (ref 8.9–10.3)
Calcium: 8.6 mg/dL — ABNORMAL LOW (ref 8.9–10.3)
Calcium: 8.9 mg/dL (ref 8.9–10.3)
Chloride: 105 mmol/L (ref 98–111)
Chloride: 106 mmol/L (ref 98–111)
Chloride: 102 mmol/L (ref 98–111)
Creatinine, Ser: 1.11 mg/dL (ref 0.61–1.24)
Creatinine, Ser: 0.96 mg/dL (ref 0.61–1.24)
Creatinine, Ser: 0.79 mg/dL (ref 0.61–1.24)
GFR calc Af Amer: >60 mL/min (ref >60)
GFR calc Af Amer: >60 mL/min (ref >60)
GFR calc Af Amer: >60 mL/min (ref >60)
GFR calc non Af Amer: >60 mL/min (ref >60)
GFR calc non Af Amer: >60 mL/min (ref >60)
GFR calc non Af Amer: >60 mL/min (ref >60)
Glucose, Bld: 143 mg/dL — ABNORMAL HIGH (ref 70–99)
Glucose, Bld: 136 mg/dL — ABNORMAL HIGH (ref 70–99)
Glucose, Bld: 164 mg/dL — ABNORMAL HIGH (ref 70–99)
Potassium: 5.1 mmol/L (ref 3.5–5.1)
Potassium: 4.2 mmol/L (ref 3.5–5.1)
Potassium: 3.7 mmol/L (ref 3.5–5.1)
Sodium: 130 mmol/L — ABNORMAL LOW (ref 135–145)
Sodium: 130 mmol/L — ABNORMAL LOW (ref 135–145)
Sodium: 132 mmol/L — ABNORMAL LOW (ref 135–145)

## 2019-07-17 LAB — COMPREHENSIVE METABOLIC PANEL
ALT: 14 U/L (ref 0–44)
AST: 12 U/L — ABNORMAL LOW (ref 15–41)
Albumin: 4.4 g/dL (ref 3.5–5.0)
Alkaline Phosphatase: 60 U/L (ref 38–126)
Anion gap: 20 — ABNORMAL HIGH (ref 5–15)
BUN: 22 mg/dL — ABNORMAL HIGH (ref 6–20)
CO2: <7 mmol/L — ABNORMAL LOW (ref 22–32)
Calcium: 8.2 mg/dL — ABNORMAL LOW (ref 8.9–10.3)
Chloride: 106 mmol/L (ref 98–111)
Creatinine, Ser: 1.19 mg/dL (ref 0.61–1.24)
GFR calc Af Amer: >60 mL/min (ref >60)
GFR calc non Af Amer: >60 mL/min (ref >60)
Glucose, Bld: 137 mg/dL — ABNORMAL HIGH (ref 70–99)
Potassium: 3.8 mmol/L (ref 3.5–5.1)
Sodium: 132 mmol/L — ABNORMAL LOW (ref 135–145)
Total Bilirubin: 3 mg/dL — ABNORMAL HIGH (ref 0.3–1.2)
Total Protein: 7.1 g/dL (ref 6.5–8.1)

## 2019-07-17 LAB — CBC WITH DIFFERENTIAL/PLATELET
Abs Immature Granulocytes: 0.09 10*3/uL — ABNORMAL HIGH (ref 0.00–0.07)
Basophils Absolute: 0 10*3/uL (ref 0.0–0.1)
Basophils Relative: 0 %
Eosinophils Absolute: 0 10*3/uL (ref 0.0–0.5)
Eosinophils Relative: 0 %
HCT: 41.7 % (ref 39.0–52.0)
Hemoglobin: 15 g/dL (ref 13.0–17.0)
Immature Granulocytes: 1 %
Lymphocytes Relative: 7 %
Lymphs Abs: 1.2 10*3/uL (ref 0.7–4.0)
MCH: 31.8 pg (ref 26.0–34.0)
MCHC: 36 g/dL (ref 30.0–36.0)
MCV: 88.3 fL (ref 80.0–100.0)
Monocytes Absolute: 1.7 10*3/uL — ABNORMAL HIGH (ref 0.1–1.0)
Monocytes Relative: 10 %
Neutro Abs: 13.9 10*3/uL — ABNORMAL HIGH (ref 1.7–7.7)
Neutrophils Relative %: 82 %
Platelets: 287 10*3/uL (ref 150–400)
RBC: 4.72 MIL/uL (ref 4.22–5.81)
RDW: 11.7 % (ref 11.5–15.5)
WBC: 16.9 10*3/uL — ABNORMAL HIGH (ref 4.0–10.5)
nRBC: 0 % (ref 0.0–0.2)

## 2019-07-17 LAB — CBG MONITORING, ED
Glucose-Capillary: 113 mg/dL — ABNORMAL HIGH (ref 70–99)
Glucose-Capillary: 119 mg/dL — ABNORMAL HIGH (ref 70–99)
Glucose-Capillary: 134 mg/dL — ABNORMAL HIGH (ref 70–99)
Glucose-Capillary: 150 mg/dL — ABNORMAL HIGH (ref 70–99)
Glucose-Capillary: 153 mg/dL — ABNORMAL HIGH (ref 70–99)
Glucose-Capillary: 156 mg/dL — ABNORMAL HIGH (ref 70–99)
Glucose-Capillary: 162 mg/dL — ABNORMAL HIGH (ref 70–99)
Glucose-Capillary: 174 mg/dL — ABNORMAL HIGH (ref 70–99)
Glucose-Capillary: 224 mg/dL — ABNORMAL HIGH (ref 70–99)

## 2019-07-17 LAB — MAGNESIUM: Magnesium: 1.9 mg/dL (ref 1.7–2.4)

## 2019-07-17 LAB — CBC
HCT: 42.1 % (ref 39.0–52.0)
Hemoglobin: 14.8 g/dL (ref 13.0–17.0)
MCH: 31.9 pg (ref 26.0–34.0)
MCHC: 35.2 g/dL (ref 30.0–36.0)
MCV: 90.7 fL (ref 80.0–100.0)
Platelets: 276 10*3/uL (ref 150–400)
RBC: 4.64 MIL/uL (ref 4.22–5.81)
RDW: 11.7 % (ref 11.5–15.5)
WBC: 30.2 10*3/uL — ABNORMAL HIGH (ref 4.0–10.5)
nRBC: 0 % (ref 0.0–0.2)

## 2019-07-17 LAB — BETA-HYDROXYBUTYRIC ACID: Beta-Hydroxybutyric Acid: 5.12 mmol/L — ABNORMAL HIGH (ref 0.05–0.27)

## 2019-07-17 LAB — HIV ANTIBODY (ROUTINE TESTING W REFLEX): HIV Screen 4th Generation wRfx: NONREACTIVE

## 2019-07-17 LAB — HEMOGLOBIN A1C
Hgb A1c MFr Bld: 12.3 % — ABNORMAL HIGH (ref 4.8–5.6)
Mean Plasma Glucose: 306.31 mg/dL

## 2019-07-17 LAB — GLUCOSE, CAPILLARY
Glucose-Capillary: 161 mg/dL — ABNORMAL HIGH (ref 70–99)
Glucose-Capillary: 74 mg/dL (ref 70–99)
Glucose-Capillary: 106 mg/dL — ABNORMAL HIGH (ref 70–99)

## 2019-07-17 LAB — PHOSPHORUS: Phosphorus: <1 mg/dL — CL (ref 2.5–4.6)

## 2019-07-17 MED ORDER — DEXTROSE-NACL 5-0.45 % IV SOLN: INTRAVENOUS | Status: DC

## 2019-07-17 MED ORDER — SODIUM CHLORIDE 0.9 % IV SOLN: INTRAVENOUS | Status: DC

## 2019-07-17 MED ORDER — POTASSIUM PHOSPHATES 15 MMOLE/5ML IV SOLN
30.0000 mmol | Freq: Once | INTRAVENOUS | Status: AC
  Administered 2019-07-17: 30 mmol via INTRAVENOUS
  Filled 2019-07-17: qty 10

## 2019-07-17 MED ORDER — ENOXAPARIN SODIUM 40 MG/0.4ML ~~LOC~~ SOLN: 40.0000 mg | SUBCUTANEOUS | Status: DC

## 2019-07-17 MED ORDER — K PHOS MONO-SOD PHOS DI & MONO 155-852-130 MG PO TABS
500.0000 mg | ORAL_TABLET | Freq: Three times a day (TID) | ORAL | Status: DC
  Administered 2019-07-17 – 2019-07-20 (×9): 500 mg via ORAL
  Filled 2019-07-17 (×12): qty 2

## 2019-07-17 MED ORDER — INSULIN DETEMIR 100 UNIT/ML ~~LOC~~ SOLN
10.0000 [IU] | Freq: Every day | SUBCUTANEOUS | Status: DC
  Administered 2019-07-17 – 2019-07-19 (×3): 10 [IU] via SUBCUTANEOUS
  Filled 2019-07-17 (×3): qty 0.1

## 2019-07-17 MED ORDER — DEXTROSE 50 % IV SOLN: 0.0000 mL | INTRAVENOUS | Status: DC | PRN

## 2019-07-17 MED ORDER — INSULIN ASPART 100 UNIT/ML ~~LOC~~ SOLN
0.0000 [IU] | Freq: Every day | SUBCUTANEOUS | Status: DC
  Administered 2019-07-19: 3 [IU] via SUBCUTANEOUS
  Filled 2019-07-17: qty 0.05

## 2019-07-17 MED ORDER — INSULIN REGULAR(HUMAN) IN NACL 100-0.9 UT/100ML-% IV SOLN
INTRAVENOUS | Status: DC
  Administered 2019-07-17: 03:00:00 0.6 [IU]/h via INTRAVENOUS

## 2019-07-17 MED ORDER — POTASSIUM CHLORIDE 10 MEQ/100ML IV SOLN
10.0000 meq | INTRAVENOUS | Status: AC
  Administered 2019-07-17 (×2): 10 meq via INTRAVENOUS
  Filled 2019-07-17 (×2): qty 100

## 2019-07-17 MED ORDER — INSULIN ASPART 100 UNIT/ML ~~LOC~~ SOLN
0.0000 [IU] | Freq: Three times a day (TID) | SUBCUTANEOUS | Status: DC
  Administered 2019-07-18 (×2): 3 [IU] via SUBCUTANEOUS
  Administered 2019-07-19: 5 [IU] via SUBCUTANEOUS
  Administered 2019-07-19 – 2019-07-20 (×2): 3 [IU] via SUBCUTANEOUS
  Filled 2019-07-17: qty 0.15

## 2019-07-17 NOTE — ED Notes (Addendum)
Pt given sandwich, applesauce, cheese stick, and orange juice at this time and was instructed to try to eat as much as he can.

## 2019-07-17 NOTE — Progress Notes (Signed)
PROGRESS NOTE    Nathan Allen  DGU:[id number] DOB: 1995-07-17 DOA: 07/16/2019 PCP: Patient, No Pcp Per    Brief Narrative:  24 year old gentleman with history of type 1 diabetes, noncompliant presented to the emergency room with at least 3 days of shortness of breath, nausea, vomiting abdominal pain and body ache.  He had an exposure to COVID-19 person about a week ago and he thought he possibly has COVID-19.  Due to being nauseated, he did not take any insulin for last 3 days. Reviewing his past records, he probably is not taking insulin more than that. According the patient, he says that he gets mail orders from [place] for his insulin.  No local healthcare provider. In the emergency room, afebrile.  Blood pressure stable.  Heart rate 126, respiration 26, 100% on room air.  WC count 31.9.  Hemoglobin 18.  Sodium 127.  Creatinine 1.48.  ABG showed pH of 6.9, anion gap too high to calculate.  Treated as diabetic ketoacidosis.   Assessment & Plan:   Principal Problem:   DKA (diabetic ketoacidoses) ([place]) Active Problems:   Suspected COVID-19 virus infection   Hyponatremia   ARF (acute renal failure) (HCC)   Leukocytosis  Diabetic ketoacidosis: With history of uncontrolled type 1 diabetes with hyperglycemia. Patient was treated with bolus IV fluids, he received IV insulin overnight with closing of anion gap.  Improvement of nausea and vomiting.  Poor IV access. Discontinue IV insulin Start on diabetic diet, if able to tolerate we will start patient on long-acting insulin. Levemir 10 units twice a day, first dose now, keep on sliding scale insulin.  Depends upon his insulin requirement, will continue uptitrating. Detailed education given, will need local provider and insulin teaching. Hemoglobin A1c 12.3. Monitor electrolytes.  Repeat BMP and phosphorus now.  Magnesium is normal.  Recheck levels in the morning.  Hyponatremia: Pseudohyponatremia.  Corrected.  Suspected COVID-19  infection: Ruled out with PCR.  No evidence of infection.  Acute renal failure: Improved with fluid resuscitation.  Leukocytosis: WC count 30,000.  No evidence of localized infection.  Hydrated well, repeat now.  No indication for antibiotics at this time.   DVT prophylaxis: SCDs Code Status: Full code Family Communication: Patient's mother on the phone Disposition Plan: Admit to [place] bed   Consultants:   None  Procedures:   None  Antimicrobials:   None   Subjective: Patient seen and examined.  Feels tired, however his nausea vomiting has improved.  He is ready to eat.  Anion gap closed.  Remains at emergency room waiting for inpatient bed availability. He accepts that he is not consistently taking insulin.  Objective: Vitals:   07/17/19 1159 07/17/19 1200 07/17/19 1215 07/17/19 1230  BP: 109/64 105/70  114/70  Pulse: 93 (!) 113 (!) 109 (!) 105  Resp: 20     Temp:      TempSrc:      SpO2: 100% 100% 100% 100%  Weight:      Height:        Intake/Output Summary (Last 24 hours) at 07/17/2019 1245 Last data filed at 07/17/2019 0551 Gross per 24 hour  Intake --  Output 1350 ml  Net -1350 ml   Filed Weights   07/16/19 1805  Weight: 54.4 kg    Examination:  General exam: Appears calm and comfortable, not in any distress. Respiratory system: Clear to auscultation. Respiratory effort normal. Cardiovascular system: S1 & S2 heard, RRR. No JVD, murmurs, rubs, gallops or clicks. No pedal edema. Gastrointestinal  system: Abdomen is nondistended, soft and nontender. No organomegaly or masses felt. Normal bowel sounds heard. Central nervous system: Alert and oriented. No focal neurological deficits. Extremities: Symmetric 5 x 5 power. Skin: No rashes, lesions or ulcers Psychiatry: Judgement and insight appear normal. Mood & affect flat.    Data Reviewed: I have personally reviewed following labs and imaging studies  CBC: Recent Labs  Lab 07/16/19 1909  07/17/19 0241  WBC 31.9* 30.2*  NEUTROABS 25.9*  --   HGB 18.1* 14.8  HCT 52.6* 42.1  MCV 93.1 90.7  PLT 383 276   Basic Metabolic Panel: Recent Labs  Lab 07/16/19 1909 07/17/19 0200 07/17/19 0340 07/17/19 0641 07/17/19 0804  NA 127* 132* 130* 130*  --   K 4.4 3.8 5.1 4.2  --   CL 93* 106 105 106  --   CO2 <7* <7* 7* 10*  --   GLUCOSE 462* 137* 143* 136*  --   BUN 23* 22* 21* 18  --   CREATININE 1.48* 1.19 1.11 0.96  --   CALCIUM 9.3 8.2* 8.4* 8.6*  --   MG  --   --   --   --  1.9   GFR: Estimated Creatinine Clearance: 93.7 mL/min (by C-G formula based on SCr of 0.96 mg/dL). Liver Function Tests: Recent Labs  Lab 07/16/19 1909 07/17/19 0200  AST 15 12*  ALT 21 14  ALKPHOS 89 60  BILITOT 2.3* 3.0*  PROT 9.0* 7.1  ALBUMIN 5.4* 4.4   No results for input(s): LIPASE, AMYLASE in the last 168 hours. No results for input(s): AMMONIA in the last 168 hours. Coagulation Profile: No results for input(s): INR, PROTIME in the last 168 hours. Cardiac Enzymes: No results for input(s): CKTOTAL, CKMB, CKMBINDEX, TROPONINI in the last 168 hours. BNP (last 3 results) No results for input(s): PROBNP in the last 8760 hours. HbA1C: Recent Labs    07/17/19 0241  HGBA1C 12.3*   CBG: Recent Labs  Lab 07/17/19 0329 07/17/19 0437 07/17/19 0542 07/17/19 0658 07/17/19 1235  GLUCAP 153* 162* 113* 134* 119*   Lipid Profile: No results for input(s): CHOL, HDL, LDLCALC, TRIG, CHOLHDL, LDLDIRECT in the last 72 hours. Thyroid Function Tests: No results for input(s): TSH, T4TOTAL, FREET4, T3FREE, THYROIDAB in the last 72 hours. Anemia Panel: No results for input(s): VITAMINB12, FOLATE, FERRITIN, TIBC, IRON, RETICCTPCT in the last 72 hours. Sepsis Labs: Recent Labs  Lab 07/16/19 2137  LATICACIDVEN 2.8*    Recent Results (from the past 240 hour(s))  Respiratory Panel by RT PCR (Flu A&B, Covid) - Nasopharyngeal Swab     Status: None   Collection Time: 07/16/19  9:37 PM    Specimen: Nasopharyngeal Swab  Result Value Ref Range Status   SARS Coronavirus 2 by RT PCR NEGATIVE NEGATIVE Final    Comment: (NOTE) SARS-CoV-2 target nucleic acids are NOT DETECTED. The SARS-CoV-2 RNA is generally detectable in upper respiratoy specimens during the acute phase of infection. The lowest concentration of SARS-CoV-2 viral copies this assay can detect is 131 copies/mL. A negative result does not preclude SARS-Cov-2 infection and should not be used as the sole basis for treatment or other patient management decisions. A negative result may occur with  improper specimen collection/handling, submission of specimen other than nasopharyngeal swab, presence of viral mutation(s) within the areas targeted by this assay, and inadequate number of viral copies (<131 copies/mL). A negative result must be combined with clinical observations, patient history, and epidemiological information. The expected result is  Negative. Fact Sheet for Patients:  https://www.moore.com/ Fact Sheet for Healthcare Providers:  https://www.young.biz/ This test is not yet ap proved or cleared by the [place] FDA and  has been authorized for detection and/or diagnosis of SARS-CoV-2 by FDA under an Emergency Use Authorization (EUA). This EUA will remain  in effect (meaning this test can be used) for the duration of the COVID-19 declaration under Section 564(b)(1) of the Act, 21 U.S.C. section 360bbb-3(b)(1), unless the authorization is terminated or revoked sooner.    Influenza A by PCR NEGATIVE NEGATIVE Final   Influenza B by PCR NEGATIVE NEGATIVE Final    Comment: (NOTE) The Xpert Xpress SARS-CoV-2/FLU/RSV assay is intended as an aid in  the diagnosis of influenza from Nasopharyngeal swab specimens and  should not be used as a sole basis for treatment. Nasal washings and  aspirates are unacceptable for Xpert Xpress SARS-CoV-2/FLU/RSV  testing. Fact Sheet  for Patients: https://www.moore.com/ Fact Sheet for Healthcare Providers: https://www.young.biz/ This test is not yet approved or cleared by the [place] FDA and  has been authorized for detection and/or diagnosis of SARS-CoV-2 by  FDA under an Emergency Use Authorization (EUA). This EUA will remain  in effect (meaning this test can be used) for the duration of the  Covid-19 declaration under Section 564(b)(1) of the Act, 21  U.S.C. section 360bbb-3(b)(1), unless the authorization is  terminated or revoked. Performed at [facility], [address], [place], [place] [address]          Radiology Studies: [facility] Chest Port 1 View  Result Date: 07/16/2019 CLINICAL DATA:  Cough, shortness of breath. EXAM: PORTABLE CHEST 1 VIEW COMPARISON:  01/16/2019 FINDINGS: The heart size and mediastinal contours are within normal limits. Both lungs are clear. The visualized skeletal structures are unremarkable. IMPRESSION: Negative. Electronically Signed   By: Charlett Nose M.D.   On: 07/16/2019 20:15        Scheduled Meds: . insulin aspart  0-15 Units Subcutaneous TID WC  . insulin aspart  0-5 Units Subcutaneous QHS  . insulin detemir  10 Units Subcutaneous QHS   Continuous Infusions: . sodium chloride       LOS: 1 day    Time spent: 35 minutes    Dorcas Carrow, MD [facility] Pager [phone]

## 2019-07-17 NOTE — ED Notes (Signed)
Mother of patient, Gordy Savers wants an update on her son, [phone].

## 2019-07-17 NOTE — ED Notes (Signed)
When RN walked into room to check patient's CBG... RN noticed that both IV's in patient's L arm had been pulled out and one placed on top of the IV pump. RN asked patient if this was done on purpose and patient said "no I was asleep this whole time". RN paused infusions, and put in for STAT IV team at this time. Will inform floor nurse about what happened when report is called.

## 2019-07-17 NOTE — ED Notes (Signed)
Spoke to [employer], MD at this time. Will give patient something to eat, transitioning to subQ insulin.

## 2019-07-17 NOTE — ED Notes (Signed)
CBG did not cross over. CBG at bedside was 154

## 2019-07-18 DIAGNOSIS — D72825 Bandemia: Secondary | ICD-10-CM | POA: Diagnosis not present

## 2019-07-18 DIAGNOSIS — E1010 Type 1 diabetes mellitus with ketoacidosis without coma: Secondary | ICD-10-CM | POA: Diagnosis not present

## 2019-07-18 DIAGNOSIS — E871 Hypo-osmolality and hyponatremia: Secondary | ICD-10-CM | POA: Diagnosis not present

## 2019-07-18 DIAGNOSIS — N179 Acute kidney failure, unspecified: Secondary | ICD-10-CM | POA: Diagnosis not present

## 2019-07-18 LAB — CBC WITH DIFFERENTIAL/PLATELET
Abs Immature Granulocytes: 0.05 10*3/uL (ref 0.00–0.07)
Basophils Absolute: 0 10*3/uL (ref 0.0–0.1)
Basophils Relative: 0 %
Eosinophils Absolute: 0.1 10*3/uL (ref 0.0–0.5)
Eosinophils Relative: 1 %
HCT: 43.1 % (ref 39.0–52.0)
Hemoglobin: 15.3 g/dL (ref 13.0–17.0)
Immature Granulocytes: 0 %
Lymphocytes Relative: 20 %
Lymphs Abs: 2.5 10*3/uL (ref 0.7–4.0)
MCH: 31.4 pg (ref 26.0–34.0)
MCHC: 35.5 g/dL (ref 30.0–36.0)
MCV: 88.3 fL (ref 80.0–100.0)
Monocytes Absolute: 1.4 10*3/uL — ABNORMAL HIGH (ref 0.1–1.0)
Monocytes Relative: 11 %
Neutro Abs: 8.5 10*3/uL — ABNORMAL HIGH (ref 1.7–7.7)
Neutrophils Relative %: 68 %
Platelets: 281 10*3/uL (ref 150–400)
RBC: 4.88 MIL/uL (ref 4.22–5.81)
RDW: 11.9 % (ref 11.5–15.5)
WBC: 12.5 10*3/uL — ABNORMAL HIGH (ref 4.0–10.5)
nRBC: 0 % (ref 0.0–0.2)

## 2019-07-18 LAB — GLUCOSE, CAPILLARY
Glucose-Capillary: 186 mg/dL — ABNORMAL HIGH (ref 70–99)
Glucose-Capillary: 154 mg/dL — ABNORMAL HIGH (ref 70–99)

## 2019-07-18 LAB — MAGNESIUM: Magnesium: 2.2 mg/dL (ref 1.7–2.4)

## 2019-07-18 LAB — COMPREHENSIVE METABOLIC PANEL
ALT: 12 U/L (ref 0–44)
AST: 14 U/L — ABNORMAL LOW (ref 15–41)
Albumin: 4.1 g/dL (ref 3.5–5.0)
Alkaline Phosphatase: 53 U/L (ref 38–126)
Anion gap: 12 (ref 5–15)
BUN: 12 mg/dL (ref 6–20)
CO2: 21 mmol/L — ABNORMAL LOW (ref 22–32)
Calcium: 9.2 mg/dL (ref 8.9–10.3)
Chloride: 104 mmol/L (ref 98–111)
Creatinine, Ser: 0.72 mg/dL (ref 0.61–1.24)
GFR calc Af Amer: >60 mL/min (ref >60)
GFR calc non Af Amer: >60 mL/min (ref >60)
Glucose, Bld: 60 mg/dL — ABNORMAL LOW (ref 70–99)
Potassium: 3.1 mmol/L — ABNORMAL LOW (ref 3.5–5.1)
Sodium: 137 mmol/L (ref 135–145)
Total Bilirubin: 1.6 mg/dL — ABNORMAL HIGH (ref 0.3–1.2)
Total Protein: 6.7 g/dL (ref 6.5–8.1)

## 2019-07-18 LAB — PHOSPHORUS: Phosphorus: 2.3 mg/dL — ABNORMAL LOW (ref 2.5–4.6)

## 2019-07-18 MED ORDER — INSULIN GLARGINE 100 UNITS/ML SOLOSTAR PEN: 23.0000 [IU] | PEN_INJECTOR | Freq: Every day | SUBCUTANEOUS | 0 refills | Status: DC

## 2019-07-18 MED ORDER — SODIUM CHLORIDE 0.9 % IV SOLN: INTRAVENOUS | Status: DC

## 2019-07-18 MED ORDER — FREESTYLE LITE TEST VI STRP: 1.0000 | ORAL_STRIP | 5 refills | Status: DC | PRN

## 2019-07-18 MED ORDER — POTASSIUM CHLORIDE CRYS ER 20 MEQ PO TBCR
40.0000 meq | EXTENDED_RELEASE_TABLET | Freq: Once | ORAL | Status: AC
  Administered 2019-07-18: 11:00:00 40 meq via ORAL
  Filled 2019-07-18: qty 2

## 2019-07-18 MED ORDER — K PHOS MONO-SOD PHOS DI & MONO 155-852-130 MG PO TABS: 500.0000 mg | ORAL_TABLET | Freq: Three times a day (TID) | ORAL | 0 refills | Status: DC

## 2019-07-18 MED ORDER — INSULIN ASPART 100 UNIT/ML FLEXPEN: PEN_INJECTOR | SUBCUTANEOUS | 0 refills | Status: DC

## 2019-07-18 MED ORDER — FREESTYLE LANCETS MISC: 1.0000 | 5 refills | Status: DC | PRN

## 2019-07-18 MED ORDER — POTASSIUM PHOSPHATES 15 MMOLE/5ML IV SOLN
20.0000 mmol | Freq: Once | INTRAVENOUS | Status: AC
  Administered 2019-07-18: 11:00:00 20 mmol via INTRAVENOUS
  Filled 2019-07-18: qty 6.67

## 2019-07-18 NOTE — Progress Notes (Signed)
Inpatient Diabetes Program Recommendations  AACE/ADA: New Consensus Statement on Inpatient Glycemic Control (2015)  Target Ranges:  Prepandial:   less than 140 mg/dL      Peak postprandial:   less than 180 mg/dL (1-2 hours)      Critically ill patients:  140 - 180 mg/dL   Lab Results  Component Value Date   GLUCAP 106 (H) 07/17/2019   HGBA1C 12.3 (H) 07/17/2019    Review of Glycemic Control  Diabetes history: Type 1 since 2014 (age 24) Outpatient Diabetes medications: Lantus 23 units QHS, Novolog 6-23 units tidwc Current orders for Inpatient glycemic control: Levemir 10 units QHS, Novolog 0-15 units tidwc and 0-5 units QHS  HgbA1C - 12.3% - uncontrolled Had hypo this am of 60 mg/dL Poor po intake.  Inpatient Diabetes Program Recommendations:    Decrease Novolog to 0-9 units tidwc Add Novolog 3 units tidwc if pt eats > 50% meal. TOC consult for assistance in obtaining PCP and assistance with meds at discharge  Spoke with pt at length this am regarding his HgbA1C of 12.3% and diabetes control at home. Pt states he hasn't seen a doctor in approx 3 years, has just been getting refills on his insulin from [facility]. Stressed importance of obtaining a PCP/Endo to manage his diabetes. States he has low blood sugars occasionally and treats with 1/2 c OJ. States his insulin is very expensive and sometimes it's hard to afford. Discussed importance of reducing his HgbA1C to 7% to reduce risks of complications from his diabetes. Pt very appreciative of conversation and would like for me to call his mother later this morning.   Will follow. Continue to watch trends in blood sugars.   Thank you. Ailene Ards, RD, LDN, CDE Inpatient Diabetes Coordinator [phone]

## 2019-07-18 NOTE — Progress Notes (Signed)
   Vital Signs MEWS/VS Documentation      07/18/2019 1600 07/18/2019 1623 07/18/2019 1700 07/18/2019 2057   MEWS Score:  2  2  2  2    MEWS Score Color:  Yellow  Yellow  Yellow  Yellow   Resp:  --  18  --  17   Pulse:  (!) 128  (!) 129  (!) 127  (!) 117   BP:  --  120/76  --  113/66   Temp:  --  98.6 F (37 C)  --  98.9 F (37.2 C)   O2 Device:  --  Room Air  --  Room Air      No new change. Provider aware no intervention needed at this time.Patient stable no complain. Will continue to  Monitor.      07/18/2019,9:53 PM

## 2019-07-18 NOTE — Progress Notes (Signed)
Hypoglycemic Event  CBG: 68 Treatment: 4 oz juice/soda  Symptoms: None  Follow-up CBG: Time:0830 CBG Result:128  Possible Reasons for Event: Unknown  Comments/MD notified:Dr. Ghimere on unit and made aware    Beckie Busing

## 2019-07-18 NOTE — Progress Notes (Addendum)
   07/18/19 1221  MEWS Score  MEWS Temp 0  MEWS Systolic 0  MEWS Pulse 2  MEWS RR 0  MEWS LOC 0  MEWS Score 2  MEWS Score Color Yellow    Dr. Roseanna Rainbow made aware of HR 118 triggering yellow MEWS score. NO interventions needed at this time. Will continue to monitor patient.

## 2019-07-18 NOTE — Progress Notes (Signed)
PROGRESS NOTE    Unnamed Nathan Allen  IOX:[id number] DOB: 03-25-1996 DOA: 07/16/2019 PCP: Patient, No Pcp Per    Brief Narrative:  24 year old gentleman with history of type 1 diabetes, noncompliant presented to the emergency room with at least 3 days of shortness of breath, nausea, vomiting abdominal pain and body ache.  He had an exposure to COVID-19 person about a week ago and he thought he possibly has COVID-19.  Due to being nauseated, he did not take any insulin for last 3 days. Reviewing his past records, he probably is not taking insulin more than that. According the patient, he says that he gets mail orders from [place] for his insulin.  No local healthcare provider. In the emergency room, afebrile.  Blood pressure stable.  Heart rate 126, respiration 26, 100% on room air.  WC count 31.9.  Hemoglobin 18.  Sodium 127.  Creatinine 1.48.  ABG showed pH of 6.9, anion gap too high to calculate.  Treated as diabetic ketoacidosis.   Assessment & Plan:   Principal Problem:   DKA (diabetic ketoacidoses) (HCC) Active Problems:   Suspected COVID-19 virus infection   Hyponatremia   ARF (acute renal failure) (HCC)   Leukocytosis  Diabetic ketoacidosis: With history of uncontrolled type 1 diabetes with hyperglycemia. Patient was treated with bolus IV fluids and IV insulin with closing up.   Clinically improved.   Started on Lantus 10 units a day and sliding scale insulin with good control.  Some of the blood sugars are low normal.   Hemoglobin A1c 12.3.   Extensive education, counseling, prescriptions provided.    Hypophosphatemia: Severe.  Replace aggressively.  Recheck level in the morning.    Leukocytosis: Stress reaction.  Improved.    Hyponatremia: Pseudohyponatremia.  Corrected.  Suspected COVID-19 infection: Ruled out with PCR.  No evidence of infection.  Acute renal failure: Improved with fluid resuscitation.  Patient is clinically improving.  Has sinus tachycardia with no other  abnormalities. Continue IV fluids today due to persistent sinus tachycardia. Recheck electrolytes morning.  DVT prophylaxis: SCDs Code Status: Full code Family Communication: Patient's mother at the bedside.  We discussed about long-term management of insulin, also referred for local primary care provider. Disposition Plan: Anticipate home tomorrow.   Consultants:   None  Procedures:   None  Antimicrobials:   None   Subjective: Patient seen and examined.  No overnight events.  Most of the nausea and vomiting improved.  he ambulated around.  Heart rate persistently high after exertion.  Blood pressures are stable. Objective: Vitals:   07/18/19 1432 07/18/19 1600 07/18/19 1623 07/18/19 1700  BP: 127/83  120/76   Pulse: (!) 127 (!) 128 (!) 129 (!) 127  Resp: 18  18   Temp: 98.5 F (36.9 C)  98.6 F (37 C)   TempSrc: Oral  Oral   SpO2: 100%  100%   Weight:      Height:        Intake/Output Summary (Last 24 hours) at 07/18/2019 1726 Last data filed at 07/17/2019 1742 Gross per 24 hour  Intake 0 ml  Output --  Net 0 ml   Filed Weights   07/16/19 1805  Weight: 54.4 kg    Examination:  General exam: Appears calm and comfortable, not in any distress. Respiratory system: Clear to auscultation. Respiratory effort normal. Cardiovascular system: S1 & S2 heard, RRR. No JVD, murmurs, rubs, gallops or clicks. No pedal edema. Gastrointestinal system: Abdomen is nondistended, soft and nontender. No organomegaly or masses  felt. Normal bowel sounds heard. Central nervous system: Alert and oriented. No focal neurological deficits. Extremities: Symmetric 5 x 5 power. Skin: No rashes, lesions or ulcers Psychiatry: Judgement and insight appear normal. Mood & affect normal.    Data Reviewed: I have personally reviewed following labs and imaging studies  CBC: Recent Labs  Lab 07/16/19 1909 07/17/19 0241 07/17/19 1437 07/18/19 0446  WBC 31.9* 30.2* 16.9* 12.5*  NEUTROABS  25.9*  --  13.9* 8.5*  HGB 18.1* 14.8 15.0 15.3  HCT 52.6* 42.1 41.7 43.1  MCV 93.1 90.7 88.3 88.3  PLT 383 276 287 696   Basic Metabolic Panel: Recent Labs  Lab 07/17/19 0200 07/17/19 0340 07/17/19 0641 07/17/19 0804 07/17/19 1437 07/18/19 0446  NA 132* 130* 130*  --  132* 137  K 3.8 5.1 4.2  --  3.7 3.1*  CL 106 105 106  --  102 104  CO2 <7* 7* 10*  --  17* 21*  GLUCOSE 137* 143* 136*  --  164* 60*  BUN 22* 21* 18  --  16 12  CREATININE 1.19 1.11 0.96  --  0.79 0.72  CALCIUM 8.2* 8.4* 8.6*  --  8.9 9.2  MG  --   --   --  1.9  --  2.2  PHOS  --   --   --   --  <1.0* 2.3*   GFR: Estimated Creatinine Clearance: 112.4 mL/min (by C-G formula based on SCr of 0.72 mg/dL). Liver Function Tests: Recent Labs  Lab 07/16/19 1909 07/17/19 0200 07/18/19 0446  AST 15 12* 14*  ALT 21 14 12   ALKPHOS 89 60 53  BILITOT 2.3* 3.0* 1.6*  PROT 9.0* 7.1 6.7  ALBUMIN 5.4* 4.4 4.1   No results for input(s): LIPASE, AMYLASE in the last 168 hours. No results for input(s): AMMONIA in the last 168 hours. Coagulation Profile: No results for input(s): INR, PROTIME in the last 168 hours. Cardiac Enzymes: No results for input(s): CKTOTAL, CKMB, CKMBINDEX, TROPONINI in the last 168 hours. BNP (last 3 results) No results for input(s): PROBNP in the last 8760 hours. HbA1C: Recent Labs    07/17/19 0241  HGBA1C 12.3*   CBG: Recent Labs  Lab 07/17/19 1235 07/17/19 1416 07/17/19 1659 07/17/19 2125 07/18/19 1653  GLUCAP 119* 161* 74 106* 186*   Lipid Profile: No results for input(s): CHOL, HDL, LDLCALC, TRIG, CHOLHDL, LDLDIRECT in the last 72 hours. Thyroid Function Tests: No results for input(s): TSH, T4TOTAL, FREET4, T3FREE, THYROIDAB in the last 72 hours. Anemia Panel: No results for input(s): VITAMINB12, FOLATE, FERRITIN, TIBC, IRON, RETICCTPCT in the last 72 hours. Sepsis Labs: Recent Labs  Lab 07/16/19 2137  LATICACIDVEN 2.8*    Recent Results (from the past 240 hour(s))    Respiratory Panel by RT PCR (Flu A&B, Covid) - Nasopharyngeal Swab     Status: None   Collection Time: 07/16/19  9:37 PM   Specimen: Nasopharyngeal Swab  Result Value Ref Range Status   SARS Coronavirus 2 by RT PCR NEGATIVE NEGATIVE Final    Comment: (NOTE) SARS-CoV-2 target nucleic acids are NOT DETECTED. The SARS-CoV-2 RNA is generally detectable in upper respiratoy specimens during the acute phase of infection. The lowest concentration of SARS-CoV-2 viral copies this assay can detect is 131 copies/mL. A negative result does not preclude SARS-Cov-2 infection and should not be used as the sole basis for treatment or other patient management decisions. A negative result may occur with  improper specimen collection/handling, submission of specimen  other than nasopharyngeal swab, presence of viral mutation(s) within the areas targeted by this assay, and inadequate number of viral copies (<131 copies/mL). A negative result must be combined with clinical observations, patient history, and epidemiological information. The expected result is Negative. Fact Sheet for Patients:  https://www.moore.com/ Fact Sheet for Healthcare Providers:  https://www.young.biz/ This test is not yet ap proved or cleared by the [place] FDA and  has been authorized for detection and/or diagnosis of SARS-CoV-2 by FDA under an Emergency Use Authorization (EUA). This EUA will remain  in effect (meaning this test can be used) for the duration of the COVID-19 declaration under Section 564(b)(1) of the Act, 21 U.S.C. section 360bbb-3(b)(1), unless the authorization is terminated or revoked sooner.    Influenza A by PCR NEGATIVE NEGATIVE Final   Influenza B by PCR NEGATIVE NEGATIVE Final    Comment: (NOTE) The Xpert Xpress SARS-CoV-2/FLU/RSV assay is intended as an aid in  the diagnosis of influenza from Nasopharyngeal swab specimens and  should not be used as a sole  basis for treatment. Nasal washings and  aspirates are unacceptable for Xpert Xpress SARS-CoV-2/FLU/RSV  testing. Fact Sheet for Patients: https://www.moore.com/ Fact Sheet for Healthcare Providers: https://www.young.biz/ This test is not yet approved or cleared by the [place] FDA and  has been authorized for detection and/or diagnosis of SARS-CoV-2 by  FDA under an Emergency Use Authorization (EUA). This EUA will remain  in effect (meaning this test can be used) for the duration of the  Covid-19 declaration under Section 564(b)(1) of the Act, 21  U.S.C. section 360bbb-3(b)(1), unless the authorization is  terminated or revoked. Performed at [facility], [address], [place], [place] [address]          Radiology Studies: [facility] Chest Port 1 View  Result Date: 07/16/2019 CLINICAL DATA:  Cough, shortness of breath. EXAM: PORTABLE CHEST 1 VIEW COMPARISON:  01/16/2019 FINDINGS: The heart size and mediastinal contours are within normal limits. Both lungs are clear. The visualized skeletal structures are unremarkable. IMPRESSION: Negative. Electronically Signed   By: Charlett Nose M.D.   On: 07/16/2019 20:15        Scheduled Meds: . insulin aspart  0-15 Units Subcutaneous TID WC  . insulin aspart  0-5 Units Subcutaneous QHS  . insulin detemir  10 Units Subcutaneous QHS  . phosphorus  500 mg Oral TID   Continuous Infusions: . sodium chloride       LOS: 2 days    Time spent: 25 minutes    Dorcas Carrow, MD [facility] Pager [phone]

## 2019-07-18 NOTE — Progress Notes (Signed)
Patients heart rate remains in the 120's and is asymptomatic. Doctor Ghimire made aware of heart rate and will not discharge patient at this time.

## 2019-07-19 DIAGNOSIS — E1010 Type 1 diabetes mellitus with ketoacidosis without coma: Secondary | ICD-10-CM | POA: Diagnosis not present

## 2019-07-19 LAB — CBC WITH DIFFERENTIAL/PLATELET
Abs Immature Granulocytes: 0.02 10*3/uL (ref 0.00–0.07)
Basophils Absolute: 0 10*3/uL (ref 0.0–0.1)
Basophils Relative: 0 %
Eosinophils Absolute: 0.3 10*3/uL (ref 0.0–0.5)
Eosinophils Relative: 4 %
HCT: 39.3 % (ref 39.0–52.0)
Hemoglobin: 14 g/dL (ref 13.0–17.0)
Immature Granulocytes: 0 %
Lymphocytes Relative: 33 %
Lymphs Abs: 2.6 10*3/uL (ref 0.7–4.0)
MCH: 31.6 pg (ref 26.0–34.0)
MCHC: 35.6 g/dL (ref 30.0–36.0)
MCV: 88.7 fL (ref 80.0–100.0)
Monocytes Absolute: 1 10*3/uL (ref 0.1–1.0)
Monocytes Relative: 12 %
Neutro Abs: 3.9 10*3/uL (ref 1.7–7.7)
Neutrophils Relative %: 51 %
Platelets: 240 10*3/uL (ref 150–400)
RBC: 4.43 MIL/uL (ref 4.22–5.81)
RDW: 11.8 % (ref 11.5–15.5)
WBC: 7.9 10*3/uL (ref 4.0–10.5)
nRBC: 0 % (ref 0.0–0.2)

## 2019-07-19 LAB — GLUCOSE, CAPILLARY
Glucose-Capillary: 68 mg/dL — ABNORMAL LOW (ref 70–99)
Glucose-Capillary: 128 mg/dL — ABNORMAL HIGH (ref 70–99)
Glucose-Capillary: 164 mg/dL — ABNORMAL HIGH (ref 70–99)
Glucose-Capillary: 112 mg/dL — ABNORMAL HIGH (ref 70–99)
Glucose-Capillary: 185 mg/dL — ABNORMAL HIGH (ref 70–99)
Glucose-Capillary: 206 mg/dL — ABNORMAL HIGH (ref 70–99)
Glucose-Capillary: 263 mg/dL — ABNORMAL HIGH (ref 70–99)

## 2019-07-19 LAB — COMPREHENSIVE METABOLIC PANEL
ALT: 9 U/L (ref 0–44)
AST: 11 U/L — ABNORMAL LOW (ref 15–41)
Albumin: 3.7 g/dL (ref 3.5–5.0)
Alkaline Phosphatase: 50 U/L (ref 38–126)
Anion gap: 10 (ref 5–15)
BUN: 10 mg/dL (ref 6–20)
CO2: 26 mmol/L (ref 22–32)
Calcium: 8.9 mg/dL (ref 8.9–10.3)
Chloride: 103 mmol/L (ref 98–111)
Creatinine, Ser: 0.59 mg/dL — ABNORMAL LOW (ref 0.61–1.24)
GFR calc Af Amer: >60 mL/min (ref >60)
GFR calc non Af Amer: >60 mL/min (ref >60)
Glucose, Bld: 110 mg/dL — ABNORMAL HIGH (ref 70–99)
Potassium: 2.8 mmol/L — ABNORMAL LOW (ref 3.5–5.1)
Sodium: 139 mmol/L (ref 135–145)
Total Bilirubin: 2.2 mg/dL — ABNORMAL HIGH (ref 0.3–1.2)
Total Protein: 6 g/dL — ABNORMAL LOW (ref 6.5–8.1)

## 2019-07-19 LAB — MAGNESIUM: Magnesium: 2.1 mg/dL (ref 1.7–2.4)

## 2019-07-19 LAB — BASIC METABOLIC PANEL
Anion gap: 12 (ref 5–15)
BUN: 11 mg/dL (ref 6–20)
CO2: 26 mmol/L (ref 22–32)
Calcium: 9 mg/dL (ref 8.9–10.3)
Chloride: 102 mmol/L (ref 98–111)
Creatinine, Ser: 0.6 mg/dL — ABNORMAL LOW (ref 0.61–1.24)
GFR calc Af Amer: >60 mL/min (ref >60)
GFR calc non Af Amer: >60 mL/min (ref >60)
Glucose, Bld: 225 mg/dL — ABNORMAL HIGH (ref 70–99)
Potassium: 3.3 mmol/L — ABNORMAL LOW (ref 3.5–5.1)
Sodium: 140 mmol/L (ref 135–145)

## 2019-07-19 LAB — TSH: TSH: 1.159 u[IU]/mL (ref 0.350–4.500)

## 2019-07-19 LAB — PHOSPHORUS: Phosphorus: 4.1 mg/dL (ref 2.5–4.6)

## 2019-07-19 MED ORDER — POTASSIUM CHLORIDE CRYS ER 20 MEQ PO TBCR
40.0000 meq | EXTENDED_RELEASE_TABLET | Freq: Two times a day (BID) | ORAL | Status: AC
  Administered 2019-07-19 (×2): 40 meq via ORAL
  Filled 2019-07-19 (×2): qty 2

## 2019-07-19 NOTE — Progress Notes (Signed)
PROGRESS NOTE  Nathan Allen ZYS:[id number] DOB: 06-01-1996 DOA: 07/16/2019 PCP: Patient, No Pcp Per  HPI/Recap of past 61 hours: 24 year old gentleman with history of type 1 diabetes, noncompliant presented to the emergency room with at least 3 days of shortness of breath, nausea, vomiting abdominal pain and body ache.  He had an exposure to COVID-19 person about a week ago and he thought he possibly has COVID-19.  Due to being nauseated, he did not take any insulin for last 3 days. Reviewing his past records, he probably is not taking insulin more than that. According the patient, he says that he gets mail orders from [place] for his insulin.  No local healthcare provider. In the emergency room, afebrile.  Blood pressure stable.  Heart rate 126, respiration 26, 100% on room air.  WC count 31.9.  Hemoglobin 18.  Sodium 127.  Creatinine 1.48.  ABG showed pH of 6.9, anion gap too high to calculate.  Treated as diabetic ketoacidosis.  07/19/19: Seen this morning in his room.  No acute events overnight.  Transition to long-acting and short acting insulin.  Case manager consulted to assist with medications.  Tachycardic this morning likely secondary to dehydration.  Continue IV fluid hydration.  Hypokalemic, repleted.  Repeat BMP at 1400.  Assessment/Plan: Principal Problem:   DKA (diabetic ketoacidoses) (HCC) Active Problems:   Suspected COVID-19 virus infection   Hyponatremia   ARF (acute renal failure) (HCC)   Leukocytosis  Diabetic ketoacidosis: With history of uncontrolled type 1 diabetes with hyperglycemia. Completed DKA protocol. Continue Lantus and NovoLog Hemoglobin A1c 12.3. Diabetes coordinator following, appreciate assistance. Case manager to assist with insulin plan to DC Will need to closely follow-up with PCP or endocrinologist posthospitalization  High anion gap metabolic acidosis in the setting of DKA type I Resolved post treatment of DKA  Uncontrolled type 1 diabetes  with hyperglycemia Management as stated above  Sinus tachycardia in the setting of dehydration from DKA Heart rate decreasing in the low 100 Continue IV fluid hydration Obtain TSH Continue to monitor vital signs  Refractory hypokalemia likely secondary to insulin drip Repleted Repeat BMP at 1400  Resolved hypophosphatemia: Severe.    Post repletion.  Resolved leukocytosis: Stress reaction.   Resolved hyponatremia: Pseudohyponatremia.  Corrected.  Suspected COVID-19 infection: Ruled out with PCR.  No evidence of infection.  Resolved acute renal failure likely prerenal secondary to dehydration from DKA: Resolved with fluid resuscitation.  Renal function is back to baseline.    DVT prophylaxis: SCDs Code Status: Full code Family Communication:  Updated patient's mother on the phone on 07/19/2019.    Disposition Plan: Anticipate discharge to home tomorrow 07/20/19 after case manager assists with insulin.   Consultants:   None  Procedures:   None  Antimicrobials:   None    Objective: Vitals:   07/18/19 2057 07/19/19 0037 07/19/19 0502 07/19/19 0816  BP: 113/66 117/77 113/72 123/67  Pulse: (!) 117 (!) 107 (!) 102 (!) 104  Resp: 17 17 17 20   Temp: 98.9 F (37.2 C) 98.6 F (37 C) 98.6 F (37 C) 99.4 F (37.4 C)  TempSrc: Oral Oral Oral Oral  SpO2: 99% 100% 98% 97%  Weight:      Height:       No intake or output data in the 24 hours ending 07/19/19 1306 Filed Weights   07/16/19 1805  Weight: 54.4 kg    Exam:  . General: 24 y.o. year-old male well developed well nourished in no acute distress.  Alert and oriented x3. . Cardiovascular: Regular rate and rhythm with no rubs or gallops.  No thyromegaly or JVD noted.   Marland Kitchen Respiratory: Clear to auscultation with no wheezes or rales. Good inspiratory effort. . Abdomen: Soft nontender nondistended with normal bowel sounds x4 quadrants. . Musculoskeletal: No lower extremity edema. 2/4 pulses in all 4  extremities. Marland Kitchen Psychiatry: Mood is appropriate for condition and setting   Data Reviewed: CBC: Recent Labs  Lab 07/16/19 1909 07/17/19 0241 07/17/19 1437 07/18/19 0446 07/19/19 0557  WBC 31.9* 30.2* 16.9* 12.5* 7.9  NEUTROABS 25.9*  --  13.9* 8.5* 3.9  HGB 18.1* 14.8 15.0 15.3 14.0  HCT 52.6* 42.1 41.7 43.1 39.3  MCV 93.1 90.7 88.3 88.3 88.7  PLT 383 276 287 281 536   Basic Metabolic Panel: Recent Labs  Lab 07/17/19 0340 07/17/19 0641 07/17/19 0804 07/17/19 1437 07/18/19 0446 07/19/19 0557  NA 130* 130*  --  132* 137 139  K 5.1 4.2  --  3.7 3.1* 2.8*  CL 105 106  --  102 104 103  CO2 7* 10*  --  17* 21* 26  GLUCOSE 143* 136*  --  164* 60* 110*  BUN 21* 18  --  16 12 10   CREATININE 1.11 0.96  --  0.79 0.72 0.59*  CALCIUM 8.4* 8.6*  --  8.9 9.2 8.9  MG  --   --  1.9  --  2.2 2.1  PHOS  --   --   --  <1.0* 2.3* 4.1   GFR: Estimated Creatinine Clearance: 112.4 mL/min (A) (by C-G formula based on SCr of 0.59 mg/dL (L)). Liver Function Tests: Recent Labs  Lab 07/16/19 1909 07/17/19 0200 07/18/19 0446 07/19/19 0557  AST 15 12* 14* 11*  ALT 21 14 12 9   ALKPHOS 89 60 53 50  BILITOT 2.3* 3.0* 1.6* 2.2*  PROT 9.0* 7.1 6.7 6.0*  ALBUMIN 5.4* 4.4 4.1 3.7   No results for input(s): LIPASE, AMYLASE in the last 168 hours. No results for input(s): AMMONIA in the last 168 hours. Coagulation Profile: No results for input(s): INR, PROTIME in the last 168 hours. Cardiac Enzymes: No results for input(s): CKTOTAL, CKMB, CKMBINDEX, TROPONINI in the last 168 hours. BNP (last 3 results) No results for input(s): PROBNP in the last 8760 hours. HbA1C: Recent Labs    07/17/19 0241  HGBA1C 12.3*   CBG: Recent Labs  Lab 07/17/19 2125 07/18/19 1653 07/18/19 2101 07/19/19 0737 07/19/19 1235  GLUCAP 106* 186* 154* 112* 185*   Lipid Profile: No results for input(s): CHOL, HDL, LDLCALC, TRIG, CHOLHDL, LDLDIRECT in the last 72 hours. Thyroid Function Tests: No results for  input(s): TSH, T4TOTAL, FREET4, T3FREE, THYROIDAB in the last 72 hours. Anemia Panel: No results for input(s): VITAMINB12, FOLATE, FERRITIN, TIBC, IRON, RETICCTPCT in the last 72 hours. Urine analysis:    Component Value Date/Time   COLORURINE YELLOW 07/17/2019 0037   APPEARANCEUR CLEAR 07/17/2019 0037   LABSPEC 1.021 07/17/2019 0037   PHURINE 5.0 07/17/2019 0037   GLUCOSEU >=500 (A) 07/17/2019 0037   HGBUR MODERATE (A) 07/17/2019 0037   BILIRUBINUR NEGATIVE 07/17/2019 0037   KETONESUR 80 (A) 07/17/2019 0037   PROTEINUR 100 (A) 07/17/2019 0037   NITRITE NEGATIVE 07/17/2019 0037   LEUKOCYTESUR NEGATIVE 07/17/2019 0037   Sepsis Labs: @LABRCNTIP (procalcitonin:4,lacticidven:4)  ) Recent Results (from the past 240 hour(s))  Culture, blood (routine x 2)     Status: None (Preliminary result)   Collection Time: 07/16/19  9:37 PM  Specimen: BLOOD LEFT ARM  Result Value Ref Range Status   Specimen Description   Final    BLOOD LEFT ARM Performed at [facility], [address] [place], [place] [address]    Special Requests   Final    BOTTLES DRAWN AEROBIC AND ANAEROBIC Blood Culture adequate volume Performed at [facility], [address], [place], [place] [address]    Culture   Final    NO GROWTH 2 DAYS Performed at [facility], [address], [place], [place] [address]    Report Status PENDING  Incomplete  Culture, blood (routine x 2)     Status: None (Preliminary result)   Collection Time: 07/16/19  9:37 PM   Specimen: BLOOD  Result Value Ref Range Status   Specimen Description   Final    BLOOD BLOOD LEFT FOREARM Performed at [facility], [address] [place], [place] [address]    Special Requests   Final    BOTTLES DRAWN AEROBIC ONLY Blood Culture results may not be optimal due to an inadequate volume of blood received in culture bottles Performed at [facility], [address] [place], [place] [address]    Culture   Final    NO GROWTH 2 DAYS Performed at [facility], [address] [place], [place] [address]    Report Status PENDING  Incomplete  Respiratory Panel by RT PCR (Flu A&B, Covid) - Nasopharyngeal Swab     Status: None   Collection Time: 07/16/19  9:37 PM   Specimen: Nasopharyngeal Swab  Result Value Ref Range Status   SARS Coronavirus 2 by RT PCR NEGATIVE NEGATIVE Final    Comment: (NOTE) SARS-CoV-2 target nucleic acids are NOT DETECTED. The SARS-CoV-2 RNA is generally detectable in upper respiratoy specimens during the acute phase of infection. The lowest concentration of SARS-CoV-2 viral copies this assay can detect is 131 copies/mL. A negative result does not preclude SARS-Cov-2 infection and should not be used as the sole basis for treatment or other patient management decisions. A negative result may occur with  improper specimen collection/handling, submission of specimen other than nasopharyngeal swab, presence of viral mutation(s) within the areas targeted by this assay, and inadequate number of viral copies (<131 copies/mL). A negative result must be combined with clinical observations, patient history, and epidemiological information. The expected result is Negative. Fact Sheet for Patients:  https://www.moore.com/ Fact Sheet for Healthcare Providers:  https://www.young.biz/ This test is not yet ap proved or cleared by the [place] FDA and  has been authorized for detection and/or diagnosis of SARS-CoV-2 by FDA under an Emergency Use Authorization (EUA). This EUA will remain  in effect (meaning this test can be used) for the duration of the COVID-19 declaration under Section 564(b)(1) of the Act, 21 U.S.C. section 360bbb-3(b)(1), unless the authorization is terminated or revoked sooner.    Influenza A by PCR NEGATIVE NEGATIVE Final   Influenza B by PCR NEGATIVE NEGATIVE Final     Comment: (NOTE) The Xpert Xpress SARS-CoV-2/FLU/RSV assay is intended as an aid in  the diagnosis of influenza from Nasopharyngeal swab specimens and  should not be used as a sole basis for treatment. Nasal washings and  aspirates are unacceptable for Xpert Xpress SARS-CoV-2/FLU/RSV  testing. Fact Sheet for Patients: https://www.moore.com/ Fact Sheet for Healthcare Providers: https://www.young.biz/ This test is not yet approved or cleared by the [place] FDA and  has been authorized for detection and/or  diagnosis of SARS-CoV-2 by  FDA under an Emergency Use Authorization (EUA). This EUA will remain  in effect (meaning this test can be used) for the duration of the  Covid-19 declaration under Section 564(b)(1) of the Act, 21  U.S.C. section 360bbb-3(b)(1), unless the authorization is  terminated or revoked. Performed at [facility], [address] [place], [place] [address]       Studies: No results found.  Scheduled Meds: . insulin aspart  0-15 Units Subcutaneous TID WC  . insulin aspart  0-5 Units Subcutaneous QHS  . insulin detemir  10 Units Subcutaneous QHS  . phosphorus  500 mg Oral TID  . potassium chloride  40 mEq Oral BID    Continuous Infusions: . sodium chloride 100 mL/hr at 07/18/19 1804     LOS: 3 days     Darlin Drop, MD [facility] Pager [phone]  If 7PM-7AM, please contact night-coverage [email or web address] Password [email or web address] 07/19/2019, 1:06 PM

## 2019-07-20 DIAGNOSIS — E1010 Type 1 diabetes mellitus with ketoacidosis without coma: Secondary | ICD-10-CM | POA: Diagnosis not present

## 2019-07-20 LAB — GLUCOSE, CAPILLARY
Glucose-Capillary: 120 mg/dL — ABNORMAL HIGH (ref 70–99)
Glucose-Capillary: 155 mg/dL — ABNORMAL HIGH (ref 70–99)

## 2019-07-20 LAB — COMPREHENSIVE METABOLIC PANEL
ALT: 9 U/L (ref 0–44)
AST: 10 U/L — ABNORMAL LOW (ref 15–41)
Albumin: 3.6 g/dL (ref 3.5–5.0)
Alkaline Phosphatase: 42 U/L (ref 38–126)
Anion gap: 8 (ref 5–15)
BUN: 9 mg/dL (ref 6–20)
CO2: 30 mmol/L (ref 22–32)
Calcium: 8.8 mg/dL — ABNORMAL LOW (ref 8.9–10.3)
Chloride: 102 mmol/L (ref 98–111)
Creatinine, Ser: 0.51 mg/dL — ABNORMAL LOW (ref 0.61–1.24)
GFR calc Af Amer: >60 mL/min (ref >60)
GFR calc non Af Amer: >60 mL/min (ref >60)
Glucose, Bld: 162 mg/dL — ABNORMAL HIGH (ref 70–99)
Potassium: 3.2 mmol/L — ABNORMAL LOW (ref 3.5–5.1)
Sodium: 140 mmol/L (ref 135–145)
Total Bilirubin: 1.5 mg/dL — ABNORMAL HIGH (ref 0.3–1.2)
Total Protein: 5.6 g/dL — ABNORMAL LOW (ref 6.5–8.1)

## 2019-07-20 MED ORDER — INSULIN GLARGINE 100 UNIT/ML ~~LOC~~ SOLN: 10.0000 [IU] | Freq: Two times a day (BID) | SUBCUTANEOUS | Status: DC

## 2019-07-20 MED ORDER — INSULIN ASPART 100 UNIT/ML ~~LOC~~ SOLN
3.0000 [IU] | Freq: Three times a day (TID) | SUBCUTANEOUS | Status: DC
  Administered 2019-07-20: 09:00:00 3 [IU] via SUBCUTANEOUS

## 2019-07-20 MED ORDER — POTASSIUM CHLORIDE CRYS ER 10 MEQ PO TBCR: 10.0000 meq | EXTENDED_RELEASE_TABLET | Freq: Every day | ORAL | 0 refills | Status: DC

## 2019-07-20 MED ORDER — POTASSIUM CHLORIDE CRYS ER 20 MEQ PO TBCR
40.0000 meq | EXTENDED_RELEASE_TABLET | Freq: Once | ORAL | Status: AC
  Administered 2019-07-20: 09:00:00 40 meq via ORAL
  Filled 2019-07-20: qty 2

## 2019-07-20 MED ORDER — INSULIN GLARGINE 100 UNIT/ML ~~LOC~~ SOLN
8.0000 [IU] | Freq: Two times a day (BID) | SUBCUTANEOUS | Status: DC
  Administered 2019-07-20: 09:00:00 8 [IU] via SUBCUTANEOUS
  Filled 2019-07-20 (×2): qty 0.08

## 2019-07-20 MED ORDER — INSULIN GLARGINE 100 UNITS/ML SOLOSTAR PEN: 7.0000 [IU] | PEN_INJECTOR | Freq: Two times a day (BID) | SUBCUTANEOUS | 0 refills | Status: DC

## 2019-07-20 MED ORDER — INSULIN ASPART 100 UNIT/ML FLEXPEN: PEN_INJECTOR | SUBCUTANEOUS | 0 refills | Status: DC

## 2019-07-20 MED ORDER — POTASSIUM CHLORIDE CRYS ER 20 MEQ PO TBCR
40.0000 meq | EXTENDED_RELEASE_TABLET | Freq: Once | ORAL | Status: AC
  Administered 2019-07-20: 12:00:00 40 meq via ORAL
  Filled 2019-07-20: qty 2

## 2019-07-20 MED ORDER — K PHOS MONO-SOD PHOS DI & MONO 155-852-130 MG PO TABS: 500.0000 mg | ORAL_TABLET | Freq: Three times a day (TID) | ORAL | 0 refills | Status: AC

## 2019-07-20 NOTE — Progress Notes (Signed)
Pt discharged home in stable condition. Discharge instructions and script given to patient. Pt verbalized understanding. No immediate questions or concerns at this time.

## 2019-07-20 NOTE — TOC Initial Note (Signed)
Transition of Care [facility]) - Initial/Assessment Note    Patient Details  Name: Nathan Allen MRN: [id number] Date of Birth: May 31, 1996  Transition of Care (TOC) CM/SW Contact:    Joaquin Courts, RN Phone Number: 07/20/2019, 10:26 AM  Clinical Narrative:     CM noted consult for concerns with insulin affordability. CM spoke with patient who reports he had concerns but has spoken with his parents and now feels he is able to afford the medication. 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  CM instructed patient to reach out to the number listed on the back of his card for a complete list of in network pcp providers and set up care with one of them.  CM did discuss that should patient ever find himself in a situation where he is uninsured and unable to afford medications, he can reach out to [facility] to set up care and apply for their financial assistance program. No further CM needs identified.               Expected Discharge Plan: Home/Self Care Barriers to Discharge: No Barriers Identified   Patient Goals and CMS Choice Patient states their goals for this hospitalization and ongoing recovery are:: to go home      Expected Discharge Plan and Services Expected Discharge Plan: Home/Self Care   Discharge Planning Services: CM Consult   Living arrangements for the past 2 months: Single Family Home Expected Discharge Date: 07/20/19               DME Arranged: N/A DME Agency: NA       HH Arranged: NA [place] Agency: NA        Prior Living Arrangements/Services Living arrangements for the past 2 months: Single Family Home Lives with:: Parents Patient language and need for interpreter reviewed:: Yes Do you feel safe going back to the place where you live?: Yes      Need for Family Participation in Patient Care: Yes (Comment) Care giver support system in place?: Yes (comment)   Criminal  Activity/Legal Involvement Pertinent to Current Situation/Hospitalization: No - Comment as needed  Activities of Daily Living Home Assistive Devices/Equipment: CBG Meter ADL Screening (condition at time of admission) Patient's cognitive ability adequate to safely complete daily activities?: Yes Is the patient deaf or have difficulty hearing?: No Does the patient have difficulty seeing, even when wearing glasses/contacts?: No Does the patient have difficulty concentrating, remembering, or making decisions?: No Patient able to express need for assistance with ADLs?: Yes Does the patient have difficulty dressing or bathing?: No Independently performs ADLs?: Yes (appropriate for developmental age)(patient currently experiencing weakness) Does the patient have difficulty walking or climbing stairs?: No Weakness of Legs: Both Weakness of Arms/Hands: None  Permission Sought/Granted                  Emotional Assessment   Attitude/Demeanor/Rapport: Engaged Affect (typically observed): Accepting Orientation: : Oriented to Self, Oriented to Place, Oriented to  Time, Oriented to Situation   Psych Involvement: No (comment)  Admission diagnosis:  DKA (diabetic ketoacidoses) ([place]) [E11.10] Diabetic ketoacidosis without coma associated with type 1 diabetes mellitus ([place]) [E10.10] Person under investigation for COVID-19 [Z20.822] Patient Active Problem List   Diagnosis Date Noted  . DKA (diabetic ketoacidoses) ([place]) 07/16/2019  . Suspected COVID-19 virus infection 07/16/2019  . Hyponatremia 07/16/2019  . ARF (acute renal failure) ([place]) 07/16/2019  .  Leukocytosis 07/16/2019   PCP:  Patient, No Pcp Per Pharmacy:  No Pharmacies Listed    Social Determinants of Health (SDOH) Interventions    Readmission Risk Interventions No flowsheet data found.

## 2019-07-20 NOTE — Discharge Summary (Addendum)
Discharge Summary  Nathan Allen TIW:[id number] DOB: Jan 24, 1996  PCP: Patient, No Pcp Per  Admit date: 07/16/2019 Discharge date: 07/20/2019  Time spent: 35 minutes   Recommendations for Outpatient Follow-up:  1. Follow-up with your primary care provider or endocrinologist 2. Take your medications as prescribed  Discharge Diagnoses:  Active Hospital Problems   Diagnosis Date Noted  . DKA (diabetic ketoacidoses) ([facility]) 07/16/2019  . Suspected COVID-19 virus infection 07/16/2019  . Hyponatremia 07/16/2019  . ARF (acute renal failure) ([facility]) 07/16/2019  . Leukocytosis 07/16/2019    Resolved Hospital Problems  No resolved problems to display.    Discharge Condition: Stable  Diet recommendation: Carb modified diet.  Vitals:   07/19/19 2151 07/20/19 0543  BP: 126/85 123/80  Pulse: 93 94  Resp: 16 18  Temp: 98.9 F (37.2 C) 98.6 F (37 C)  SpO2: 99% 99%    History of present illness:  24 year old gentleman with history of type 1 diabetes presented to the emergency room with at least 3 days of shortness of breath, nausea, vomiting abdominal pain and body ache. He had an exposure to COVID-19 person about a week prior to presentation and he thought he possibly had COVID-19. COVID-19 screening test negative on 07/16/2019.  According the patient, he gets mail orders from [place] for his insulin. No local healthcare provider.  Presented with leukocytosis with WBC 31K, tachycardia with heart rate 126, tachypnea with respiration rate of 26, pseudohyponatremia with sodium of 127 and serum glucose in the 460s, significant metabolic acidosis with pH of 6.9, anion gap too high to calculate, ketonuria, elevated creatinine with normal baseline.  Patient admitted for DKA type I.  Completed DKA protocol and was transitioned to long-acting and short acting insulin.  Leukocytosis, AKI and metabolic acidosis resolved post treatment.  Developed hypokalemia as a result of insulin drip.  Being repleted.   Also had isolated hyperbilirubinemia which is trending down.  Repeat CMP on Friday, 07/25/2019 at PCPs office to reassess hypokalemia and hyperbilirubinemia.  07/20/19: Seen and examined.  Vital signs and labs reviewed and are stable.  He has no new complaints.  Denies any abdominal pain or nausea.  Tolerating a diet well without any new complaints.  Updated patient's mother on the phone on 07/20/2019.  She requested printed prescription for mail order.  On the day of discharge, the patient was hemodynamically stable.  He will need to follow-up with his primary care provider or endocrinologist posthospitalization.  He will also need to take his medications as prescribed.  Hospital Course:  Principal Problem:   DKA (diabetic ketoacidoses) (HCC) Active Problems:   Suspected COVID-19 virus infection   Hyponatremia   ARF (acute renal failure) (HCC)   Leukocytosis  Resolved diabetic ketoacidosis type I: With history of uncontrolled type 1 diabetes with hyperglycemia. Completed DKA protocol. Continue Lantus 7 units twice daily and insulin sliding scale according to carb count Strongly recommend PCP or endocrinology follow-up. Hemoglobin A1c 12.3 on 07/17/2019.  Hypokalemia likely in the setting of insulin drip Potassium 3.2, repleted with p.o. KCl and K-Phos x5 days Repeat chemistry panel on Friday, 07/25/2019 at PCPs office  Isolated hyperbilirubinemia Tbilirubin trending down 1.5 on 07/20/19, peaked at 2.2 on 07/19/19 Alkaline phosphatase, AST, ALT normal No abdominal pain, no jaundice, asymptomatic Repeat CMP on Friday 07/25/19 at PCP's office  Resolved hypophosphatemia Continue replacement x5 days Follow-up with your PCP  Resolved high anion gap metabolic acidosis in the setting of DKA type I Resolved post treatment of DKA  Uncontrolled type  1 diabetes with hyperglycemia Management as stated above  Resolved sinus tachycardia in the setting of dehydration from DKA Treated with IV  fluid hydration TSH normal Heart rate back in normal range  Resolved leukocytosis: Likely reactive in the setting of DKA.  Resolved hyponatremia: Pseudohyponatremia.   Suspected COVID-19 infection: Ruled out with PCR. No evidence of infection.  Resolved acute renal failure likely prerenal secondary to dehydration from DKA: Resolved with fluid resuscitation.  Renal function is back to baseline.    Code Status:Full code  Family Communication: Updated patient's mother on the phone on 07/19/2019 and on 07/20/19.    Consultants:  None  Procedures:  None  Antimicrobials:   None    Discharge Exam: BP 123/80 (BP Location: Right Arm)   Pulse 94   Temp 98.6 F (37 C) (Oral)   Resp 18   Ht 5\' 10"  (1.778 m)   Wt 54.4 kg   SpO2 99%   BMI 17.22 kg/m  . General: 24 y.o. year-old male well developed well nourished in no acute distress.  Alert and oriented x3. . Cardiovascular: Regular rate and rhythm with no rubs or gallops.  No thyromegaly or JVD noted.   36 Respiratory: Clear to auscultation with no wheezes or rales. Good inspiratory effort. . Abdomen: Soft nontender nondistended with normal bowel sounds x4 quadrants. . Musculoskeletal: No lower extremity edema. 2/4 pulses in all 4 extremities. Marland Kitchen Psychiatry: Mood is appropriate for condition and setting  Discharge Instructions You were cared for by a hospitalist during your hospital stay. If you have any questions about your discharge medications or the care you received while you were in the hospital after you are discharged, you can call the unit and asked to speak with the hospitalist on call if the hospitalist that took care of you is not available. Once you are discharged, your primary care physician will handle any further medical issues. Please note that NO REFILLS for any discharge medications will be authorized once you are discharged, as it is imperative that you return to your primary care physician (or  establish a relationship with a primary care physician if you do not have one) for your aftercare needs so that they can reassess your need for medications and monitor your lab values.   Allergies as of 07/20/2019   No Known Allergies     Medication List    STOP taking these medications   insulin aspart 100 UNIT/ML injection Commonly known as: novoLOG Replaced by: insulin aspart 100 UNIT/ML FlexPen   insulin glargine 100 UNIT/ML injection Commonly known as: LANTUS Replaced by: insulin glargine 100 unit/mL Sopn   ondansetron 4 MG tablet Commonly known as: ZOFRAN     TAKE these medications   freestyle lancets 1 each by Other route as needed for other.   FREESTYLE LITE test strip Generic drug: glucose blood 1 each by Other route as needed for other.   insulin aspart 100 UNIT/ML FlexPen Commonly known as: NOVOLOG 3-23 units as per carb count and sliding scale. Replaces: insulin aspart 100 UNIT/ML injection   insulin glargine 100 unit/mL Sopn Commonly known as: LANTUS Inject 0.07 mLs (7 Units total) into the skin 2 (two) times daily. Replaces: insulin glargine 100 UNIT/ML injection   phosphorus 155-852-130 MG tablet Commonly known as: K PHOS NEUTRAL Take 2 tablets (500 mg total) by mouth 3 (three) times daily for 5 days.   potassium chloride 10 MEQ tablet Commonly known as: KLOR-CON Take 1 tablet (10 mEq total) by mouth  daily for 5 days.      No Known Allergies Follow-up Information    [facility]. Call in 1 day(s).   Why: Please call for a post hospital follow-up appointment. Contact information: [address] [place] [phone]           The results of significant diagnostics from this hospitalization (including imaging, microbiology, ancillary and laboratory) are listed below for reference.    Significant Diagnostic Studies: DG Chest Port 1 View  Result Date: 07/16/2019 CLINICAL DATA:   Cough, shortness of breath. EXAM: PORTABLE CHEST 1 VIEW COMPARISON:  01/16/2019 FINDINGS: The heart size and mediastinal contours are within normal limits. Both lungs are clear. The visualized skeletal structures are unremarkable. IMPRESSION: Negative. Electronically Signed   By: Charlett NoseKevin  Dover M.D.   On: 07/16/2019 20:15    Microbiology: Recent Results (from the past 240 hour(s))  Culture, blood (routine x 2)     Status: None (Preliminary result)   Collection Time: 07/16/19  9:37 PM   Specimen: BLOOD LEFT ARM  Result Value Ref Range Status   Specimen Description   Final    BLOOD LEFT ARM Performed at [facility], [address] [place], [place] [address]    Special Requests   Final    BOTTLES DRAWN AEROBIC AND ANAEROBIC Blood Culture adequate volume Performed at [facility], [address] [place], [place] [address]    Culture   Final    NO GROWTH 3 DAYS Performed at [facility], [address] [place], [place] [address]    Report Status PENDING  Incomplete  Culture, blood (routine x 2)     Status: None (Preliminary result)   Collection Time: 07/16/19  9:37 PM   Specimen: BLOOD  Result Value Ref Range Status   Specimen Description   Final    BLOOD BLOOD LEFT FOREARM Performed at [facility], [address] [place], [place] [address]    Special Requests   Final    BOTTLES DRAWN AEROBIC ONLY Blood Culture results may not be optimal due to an inadequate volume of blood received in culture bottles Performed at [facility], [address] [place], [place] [address]    Culture   Final    NO GROWTH 3 DAYS Performed at [facility], [address] [place], [place] [address]    Report Status PENDING  Incomplete  Respiratory Panel by RT PCR (Flu A&B, Covid) - Nasopharyngeal Swab     Status: None   Collection Time: 07/16/19  9:37 PM   Specimen: Nasopharyngeal Swab  Result Value Ref Range  Status   SARS Coronavirus 2 by RT PCR NEGATIVE NEGATIVE Final    Comment: (NOTE) SARS-CoV-2 target nucleic acids are NOT DETECTED. The SARS-CoV-2 RNA is generally detectable in upper respiratoy specimens during the acute phase of infection. The lowest concentration of SARS-CoV-2 viral copies this assay can detect is 131 copies/mL. A negative result does not preclude SARS-Cov-2 infection and should not be used as the sole basis for treatment or other patient management decisions. A negative result may occur with  improper specimen collection/handling, submission of specimen other than nasopharyngeal swab, presence of viral mutation(s) within the areas targeted by this assay, and inadequate number of viral copies (<131 copies/mL). A negative result must be combined with clinical observations, patient history, and epidemiological information. The expected result is Negative. Fact Sheet for Patients:  https://www.moore.com/ Fact Sheet for Healthcare Providers:  https://www.young.biz/ This test is not yet ap proved or cleared by the [place] FDA and  has been authorized for detection and/or diagnosis of SARS-CoV-2 by FDA under an Emergency Use Authorization (EUA). This EUA will remain  in effect (meaning this test can be used) for the duration of the COVID-19 declaration under Section 564(b)(1) of the Act, 21 U.S.C. section 360bbb-3(b)(1), unless the authorization is terminated or revoked sooner.    Influenza A by PCR NEGATIVE NEGATIVE Final   Influenza B by PCR NEGATIVE NEGATIVE Final    Comment: (NOTE) The Xpert Xpress SARS-CoV-2/FLU/RSV assay is intended as an aid in  the diagnosis of influenza from Nasopharyngeal swab specimens and  should not be used as a sole basis for treatment. Nasal washings and  aspirates are unacceptable for Xpert Xpress SARS-CoV-2/FLU/RSV  testing. Fact Sheet for  Patients: https://www.moore.com/ Fact Sheet for Healthcare Providers: https://www.young.biz/ This test is not yet approved or cleared by the [place] FDA and  has been authorized for detection and/or diagnosis of SARS-CoV-2 by  FDA under an Emergency Use Authorization (EUA). This EUA will remain  in effect (meaning this test can be used) for the duration of the  Covid-19 declaration under Section 564(b)(1) of the Act, 21  U.S.C. section 360bbb-3(b)(1), unless the authorization is  terminated or revoked. Performed at [facility], [address], [place], [place] [address]      Labs: Basic Metabolic Panel: Recent Labs  Lab 07/17/19 0641 07/17/19 0804 07/17/19 1437 07/18/19 0446 07/19/19 0557 07/19/19 1623 07/20/19 0517  NA   < >  --  132* 137 139 140 140  K   < >  --  3.7 3.1* 2.8* 3.3* 3.2*  CL   < >  --  102 104 103 102 102  CO2   < >  --  17* 21* 26 26 30   GLUCOSE   < >  --  164* 60* 110* 225* 162*  BUN   < >  --  16 12 10 11 9   CREATININE   < >  --  0.79 0.72 0.59* 0.60* 0.51*  CALCIUM   < >  --  8.9 9.2 8.9 9.0 8.8*  MG  --  1.9  --  2.2 2.1  --   --   PHOS  --   --  <1.0* 2.3* 4.1  --   --    < > = values in this interval not displayed.   Liver Function Tests: Recent Labs  Lab 07/16/19 1909 07/17/19 0200 07/18/19 0446 07/19/19 0557 07/20/19 0517  AST 15 12* 14* 11* 10*  ALT 21 14 12 9 9   ALKPHOS 89 60 53 50 42  BILITOT 2.3* 3.0* 1.6* 2.2* 1.5*  PROT 9.0* 7.1 6.7 6.0* 5.6*  ALBUMIN 5.4* 4.4 4.1 3.7 3.6   No results for input(s): LIPASE, AMYLASE in the last 168 hours. No results for input(s): AMMONIA in the last 168 hours. CBC: Recent Labs  Lab 07/16/19 1909 07/17/19 0241 07/17/19 1437 07/18/19 0446 07/19/19 0557  WBC 31.9* 30.2* 16.9* 12.5* 7.9  NEUTROABS 25.9*  --  13.9* 8.5* 3.9  HGB 18.1* 14.8 15.0 15.3 14.0  HCT 52.6* 42.1 41.7 43.1 39.3  MCV 93.1 90.7 88.3 88.3 88.7  PLT 383 276  287 281 240   Cardiac Enzymes: No results for input(s): CKTOTAL, CKMB, CKMBINDEX, TROPONINI in the last 168 hours. BNP: BNP (last 3 results) No results for input(s):  BNP in the last 8760 hours.  ProBNP (last 3 results) No results for input(s): PROBNP in the last 8760 hours.  CBG: Recent Labs  Lab 07/19/19 1235 07/19/19 1649 07/19/19 2153 07/20/19 0736 07/20/19 1203  GLUCAP 185* 206* 263* 120* 155*       Signed:  Kayleen Memos, MD [facility] 07/20/2019, 2:30 PM

## 2019-07-20 NOTE — Discharge Instructions (Signed)
Preventing Diabetic Ketoacidosis Diabetic ketoacidosis (DKA) is a life-threatening complication of diabetes (diabetes mellitus). It develops when there is not enough of a hormone called insulin in the body. If the body does not have enough insulin, it cannot divide (break down) sugar (glucose) into usable cells, so it breaks down fats instead. This leads to the production of acids (ketones), which can cause the blood to have too much acid in it (acidosis). DKA is a medical emergency that must be treated at the hospital. You may be more likely to develop DKA if you have type 1 diabetes and you take insulin. You can prevent DKA by working closely with your health care provider to manage your diabetes. What nutrition changes can be made?   Follow your meal plan, as directed by your health care provider or diet and nutrition specialist (dietitian).  Eat healthy meals at about the same time every day. Have healthy snacks between meals.  Avoid not eating for long periods of time. Do not skip meals, especially if you are ill.  Avoid regularly eating foods that contain a lot of sugar. Also avoid drinking alcohol. Sugary food and alcohol increase your risk of high blood glucose (hyperglycemia), which increases your risk for DKA.  Drink enough fluid to keep your urine pale yellow. Dehydration increases your risk for DKA. What actions can I take to lower my risk? To lower your risk for diabetic ketoacidosis, manage your diabetes as directed by your health care provider:  Take insulin and other diabetes medicines as directed.  Check your blood glucose every day, as often as directed.  Follow your sick day plan whenever you cannot eat or drink as usual. Make this plan in advance with your health care provider.  Check your urine for ketones as often as directed. ? During times when you are sick, check your ketones every 4-6 hours. ? If you develop symptoms of DKA, check your ketones right away.  If you  have ketones in your urine: ? Contact your health care provider right away. ? Do not exercise.  Know the symptoms of DKA so that you can get treatment as soon as possible.  Make sure that people at work, home, and school know how to check your blood glucose, in case you are not able to do it yourself.  Carry a medical alert card or wear medical alert jewelry that says that you have diabetes. Why are these changes important? DKA is a warning sign that your diabetes is not being well-controlled. You may need to work with your health care provider to adjust your diabetes management plan. DKA can lead to a serious medical emergency that can be life-threatening. Where to find support For more support with preventing DKA:  Talk with your health care provider.  Consider joining a support group. The American Diabetes Association has an online support community at: community.diabetes.org/home Where to find more information Learn more about preventing DKA from:  American Diabetes Association: www.diabetes.org  American Heart Association: www.heart.org Contact a health care provider if: You develop symptoms of DKA, such as:  Fatigue.  Weight loss.  Excessive thirst.  Light-headedness.  Fruity or sweet-smelling breath.  Excessive urination.  Vision changes.  Confusion or irritability.  Nausea.  Vomiting.  Rapid breathing.  Pain in the abdomen.  Feeling warm in your face (flushed). This may or may not include a reddish color coming to your face. If you develop any of these symptoms, do not wait to see if the symptoms will  go away. Get medical help right away. Call your local emergency services (911 in the [place] Do not drive yourself to the hospital. Summary  DKA may be a warning sign that your diabetes is not being well-controlled. You may need to work with your health care provider to adjust your diabetes management plan.  Preventing high blood glucose and dehydration  helps prevent DKA.  Check your urine for ketones as often as directed. You may need to check more often when your blood glucose level is high and when you are ill.  DKA is a medical emergency. Make sure you know the symptoms so that you can recognize and get treatment right away. This information is not intended to replace advice given to you by your health care provider. Make sure you discuss any questions you have with your health care provider. Document Revised: 10/11/2018 Document Reviewed: 01/18/2017 Elsevier Patient Education  [place].   Diabetic Ketoacidosis Diabetic ketoacidosis is a serious complication of diabetes. This condition develops when there is not enough insulin in the body. Insulin is an hormone that regulates blood sugar levels in the body. Normally, insulin allows glucose to enter the cells in the body. The cells break down glucose for energy. Without enough insulin, the body cannot break down glucose, so it breaks down fats instead. This leads to high blood glucose levels in the body and the production of acids that are called ketones. Ketones are poisonous at high levels. If diabetic ketoacidosis is not treated, it can cause severe dehydration and can lead to a coma or death. What are the causes? This condition develops when a lack of insulin causes the body to break down fats instead of glucose. This may be triggered by:  Stress on the body. This stress is brought on by an illness.  Infection.  Medicines that raise blood glucose levels.  Not taking diabetes medicine.  New onset of type 1 diabetes mellitus. What are the signs or symptoms? Symptoms of this condition include:  Fatigue.  Weight loss.  Excessive thirst.  Light-headedness.  Fruity or sweet-smelling breath.  Excessive urination.  Vision changes.  Confusion or irritability.  Nausea.  Vomiting.  Rapid breathing.  Abdominal pain.  Feeling flushed. How is this  diagnosed? This condition is diagnosed based on your medical history, a physical exam, and blood tests. You may also have a urine test to check for ketones. How is this treated? This condition may be treated with:  Fluid replacement. This may be done to correct dehydration.  Insulin injections. These may be given through the skin or through an IV tube.  Electrolyte replacement. Electrolytes are minerals in your blood. Electrolytes such as potassium and sodium may be given in pill form or through an IV tube.  Antibiotic medicines. These may be prescribed if your condition was caused by an infection. Diabetic ketoacidosis is a serious medical condition. You may need emergency treatment in the hospital to monitor your condition. Follow these instructions at home: Eating and drinking  Drink enough fluids to keep your urine clear or pale yellow.  If you are not able to eat, drink clear fluids in small amounts as you are able. Clear fluids include water, ice chips, fruit juice with water added (diluted), and low-calorie sports drinks. You may also have sugar-free jello or popsicles.  If you are able to eat, follow your usual diet and drink sugar-free liquids, such as water. Medicines  Take over-the-counter and prescription medicines only as told by your  health care provider.  Continue to take insulin and other diabetes medicines as told by your health care provider.  If you were prescribed an antibiotic, take it as told by your health care provider. Do not stop taking the antibiotic even if you start to feel better. General instructions   Check your urine for ketones when you are ill and as told by your health care provider. ? If your blood glucose is 240 mg/dL (13.3 mmol/L) or higher, check your urine ketones every 4-6 hours.  Check your blood glucose every day, as often as told by your health care provider. ? If your blood glucose is high, drink plenty of fluids. This helps to flush  out ketones. ? If your blood glucose is above your target for 2 tests in a row, contact your health care provider.  Carry a medical alert card or wear medical alert jewelry that says that you have diabetes.  Rest and exercise only as told by your health care provider. Do not exercise when your blood glucose is high and you have ketones in your urine.  If you get sick, call your health care provider and begin treatment quickly. Your body often needs extra insulin to fight an illness. Check your blood glucose every 4-6 hours when you are sick.  Keep all follow-up visits as told by your health care provider. This is important. Contact a health care provider if:  Your blood glucose level is higher than 240 mg/dL (13.3 mmol/L) for 2 days in a row.  You have moderate or large ketones in your urine.  You have a fever.  You cannot eat or drink without vomiting.  You have been vomiting for more than 2 hours.  You continue to have symptoms of diabetic ketoacidosis.  You develop new symptoms. Get help right away if:  Your blood glucose monitor reads "high" even when you are taking insulin.  You faint.  You have chest pain.  You have trouble breathing.  You have sudden trouble speaking or swallowing.  You have vomiting or diarrhea that gets worse after 3 hours.  You are unable to stay awake.  You have trouble thinking.  You are severely dehydrated. Symptoms of severe dehydration include: ? Extreme thirst. ? Dry mouth. ? Rapid breathing. These symptoms may represent a serious problem that is an emergency. Do not wait to see if the symptoms will go away. Get medical help right away. Call your local emergency services (911 in the [place] Do not drive yourself to the hospital. Summary  Diabetic ketoacidosis is a serious complication of diabetes. This condition develops when there is not enough insulin in the body.  This condition is diagnosed based on your medical history, a  physical exam, and blood tests. You may also have a urine test to check for ketones.  Diabetic ketoacidosis is a serious medical condition. You may need emergency treatment in the hospital to monitor your condition.  Contact your health care provider if your blood glucose is higher than 240 mg/dl for 2 days in a row or if you have moderate or large ketones in your urine. This information is not intended to replace advice given to you by your health care provider. Make sure you discuss any questions you have with your health care provider. Document Revised: 08/04/2016 Document Reviewed: 07/24/2016 Elsevier Patient Education  2020 [employer] for Eating Away From Home If You Have Diabetes Controlling your blood sugar (glucose) levels can be challenging when you do  not prepare your own meals. The following tips can help you manage your diabetes when you eat away from home. If you have questions or if you need help, work with your health care provider or diet and nutrition specialist (dietitian). Planning ahead Plan ahead if you know you will be eating away from home:  Try to eat your meals and snacks at about the same time each day. If you know your meal is going to be later than normal, make sure you have a small snack. Being very hungry can cause you to make unhealthy food choices.  Make a list of restaurants near you that offer healthy choices. If a restaurant has a carry-out menu, take the menu home and plan what you will order ahead of time.  Look up the restaurant you want to eat at online. Many chain and fast-food restaurants list nutritional information online. Use this information to choose the healthiest options and to calculate how many carbohydrates will be in your meal.  Use a carbohydrate-counting book or mobile app to look up the carbohydrate content and serving size of the foods you want to eat. Free foods A "free food" is any food or drink that has less than 5 grams of  carbohydrates and less than 20 calories per serving. These food are high in fiber and nutrients and low in calories, carbohydrates, and fats. Free foods include:  Non-starchy vegetables, such as carrots, broccoli, celery, lettuce, or green beans.  Non-sugar drinks, such as water, unsweetened coffee, or unsweetened tea.  Low-calorie salad dressings.  Sugar-free gelatin. Starting meals with a salad full of vegetables is a healthy choice that includes a lot of free foods. Avoid high-calorie salad toppings like bacon, cheese, and high-fat dressings. Ask for your salad dressing to be served on the side so that you dip your fork in the dressing and then in the salad. This allows you to control how much dressing you eat and still get the flavor with every bite. Choices to control carbohydrates   Ask your server to take away the bread basket or chips from your table.  Choose light yogurt or [place] yogurt instead of non-fat sweetened yogurt.  Order fresh fruit. A salad bar often offers fresh fruit choices. Avoid canned fruit because it is usually packed in sugar or syrup.  Order a salad, and ask for dressing on the side.  Ask for substitutes. For example, if your meal comes with french fries, ask for a side salad or steamed veggies instead. If a meal comes with fried chicken, ask for grilled chicken instead. Beverages  Choose drinks that are low in calories and sugar, such as: ? Water. ? Unsweetened tea or coffee. ? Lowfat milk.  Avoid the following drinks: ? Alcoholic beverages. ? Regular (not diet) sodas. Other tips  If you take insulin, wait to take your insulin once your food arrives to your table. This will ensure that your insulin and your food are timed correctly.  Become familiar with serving sizes and learn to recognize how many servings are in a portion. Restaurant portions are typically two to three times larger than what you really need.  Ask your server for a to-go box at the  beginning of the meal. When your food comes, leave the amount you should have on your plate, and put the rest in the to-go box so that you are not tempted to eat too much.  Consider splitting an entree with someone and ordering a side salad.  Avoid  buffets. They are typically too tempting and result in overeating. Where to find more information  American Diabetes Association: www.diabetes.org  American Association of Diabetes Educators: www.diabeteseducator.org Summary  Plan ahead when eating away from home.  Try to eat your meals and snacks at about the same time each day. If you know your meal is going to be later than normal, make sure you have a small snack. Being very hungry can cause you to make unhealthy food choices.  Ask for substitutes. For example, if your meal comes with french fries, ask for a side salad or steamed veggies instead. If a meal comes with fried chicken, ask for grilled chicken instead.  Ask for a to-go box when you order your meal. Divide your meal before you start eating. This information is not intended to replace advice given to you by your health care provider. Make sure you discuss any questions you have with your health care provider. Document Revised: 09/27/2016 Document Reviewed: 09/27/2016 Elsevier Patient Education  [place].  Type 1 Diabetes Mellitus, Self Care, Adult When you have type 1 diabetes (type 1 diabetes mellitus), you must make sure your blood sugar (glucose) stays in a healthy range. You can do this with:  Insulin.  Nutrition.  Exercise.  Lifestyle changes.  Other medicines, if needed.  Support from your doctors and others. How to stay aware of blood sugar   Check your blood sugar every day, as often as told.  Have your A1c (hemoglobin A1c) level checked two or more times a year. Have it checked more often if your doctor tells you to. Your doctor will set personal treatment goals for you. Generally, you should have  these blood sugar levels:  Before meals (preprandial): 80-130 mg/dL (4.4-7.2 mmol/L).  After meals (postprandial): below 180 mg/dL (10 mmol/L).  A1c level: less than 7%. How to manage high and low blood sugar Signs of high blood sugar High blood sugar is called hyperglycemia. Know the signs of high blood sugar. Signs may include:  Feeling: ? Thirsty. ? Hungry. ? Very tired.  Needing to pee (urinate) more than usual.  Blurry vision. Signs of low blood sugar Low blood sugar is called hypoglycemia. This is when blood sugar is at or below 70 mg/dL (3.9 mmol/L). Signs may include:  Feeling: ? Hungry. ? Worried or nervous (anxious). ? Sweaty and clammy. ? Confused. ? Dizzy. ? Sleepy. ? Sick to your stomach (nauseous).  Having: ? A fast heartbeat. ? A headache. ? A change in your vision. ? Tingling or no feeling (numbness) around your mouth, lips, or tongue. ? Jerky movements that you cannot control (seizure).  Having trouble with: ? Moving (coordination). ? Sleeping. ? Passing out (fainting). ? Getting upset easily (irritability). Treating low blood sugar To treat low blood sugar, eat or drink something sugary right away. If you can think clearly and swallow safely, follow the 15:15 rule:  Take 15 grams of a fast-acting carb (carbohydrate). Talk with your doctor about how much you should take.  Some fast-acting carbs are: ? Sugar tablets (glucose pills). Take 3-4 pills. ? 6-8 pieces of hard candy. ? 4-6 oz (120-50 mL) of fruit juice. ? 4-6 oz (120-150 mL) of regular (not diet) soda. ? Honey or sugar (1 Tbsp).  Check your blood sugar 15 minutes after you take the carb.  If your blood sugar is still at or below 70 mg/dL (3.9 mmol/L), take 15 grams of a carb again.  If your blood sugar does  not go above 70 mg/dL (3.9 mmol/L) after 3 tries, get help right away.  After your blood sugar goes back to normal, eat a meal or a snack within 1 hour. Treating very low blood  sugar If your blood sugar is at or below 54 mg/dL (3 mmol/L), you have very low blood sugar (severe hypoglycemia). This is an emergency. Do not wait to see if the symptoms will go away. Get medical help right away. Call your local emergency services (911 in the [place] If you have very low blood sugar and you cannot eat or drink, you may need a glucagon shot (injection). A family member or friend should learn how to check your blood sugar and how to give you a glucagon shot. Ask your doctor if you need to have a glucagon shot kit at home. Follow these instructions at home: Medicine  Take insulin and diabetes medicines as told.  If your doctor says you should take more or less insulin and medicines, do this exactly as told.  Do not run out of insulin or medicines. Having diabetes can put you at risk for other long-term conditions. These include heart disease and kidney disease. Your doctor may prescribe medicines to help you not have these problems. Food   Make healthy food choices. These include: ? Chicken, fish, egg whites, and beans. ? Oats, whole wheat, bulgur, brown rice, quinoa, and millet. ? Fresh fruits and vegetables. ? Low-fat dairy products. ? Nuts, avocado, olive oil, and canola oil.  Meet with a food specialist (registered dietitian). He or she can help you make an eating plan that is right for you.  Follow instructions from your doctor about what you cannot eat or drink.  Drink enough fluid to keep your pee (urine) pale yellow.  Keep track of carbs that you eat. Do this by reading food labels and learning food serving sizes.  Follow your sick day plan when you cannot eat or drink normally. Make this plan with your doctor so it is ready to use. Activity  Exercise 3 or more times a week.  Do not go more than 2 days without exercising.  Talk with your doctor before you start a new exercise. Your doctor may need to tell you to change: ? How much insulin or medicines you  take. ? How much food you eat. Lifestyle  Do not use any tobacco products. These include cigarettes, chewing tobacco, and e-cigarettes. If you need help quitting, ask your doctor.  Ask your doctor how much alcohol is safe for you.  Learn to deal with stress. If you need help with this, ask your doctor. Body care   Stay up to date with your shots (immunizations).  Have your eyes and feet checked by a doctor as often as told.  Check your skin and feet every day. Check for cuts, bruises, redness, blisters, or sores.  Brush your teeth and gums two times a day. Floss one or more times a day.  Go to the dentist one or more times every 6 months.  Stay at a healthy weight. General instructions  Take over-the-counter and prescription medicines only as told by your doctor.  Share your diabetes care plan with: ? Your work or school. ? People you live with.  Check your pee (urine) for ketones: ? When you are sick. ? As told by your doctor.  Carry a card or wear jewelry that says you have diabetes.  Keep all follow-up visits as told by your doctor. This  is important. Questions to ask your doctor  Do I need to meet with a diabetes educator?  Where can I find a support group for people with diabetes? Where to find more information To learn more about diabetes, visit:  American Diabetes Association: www.diabetes.org  American Association of Diabetes Educators: www.diabeteseducator.org Summary  When you have type 1 diabetes, you must make sure your blood sugar (glucose) stays in a healthy range.  Check your blood sugar every day, as often as told.  Take insulin and diabetes medicines as told.  Keep all follow-up visits as told by your doctor. This is important. This information is not intended to replace advice given to you by your health care provider. Make sure you discuss any questions you have with your health care provider. Document Revised: 01/10/2018 Document  Reviewed: 07/23/2015 Elsevier Patient Education  [place].  Type 1 Diabetes Mellitus, Diagnosis, Adult  Type 1 diabetes (type 1 diabetes mellitus) is a long-term (chronic) disease. It happens when your pancreas does not make enough of a hormone called insulin. Insulin lets sugars (glucose) go into cells in your body. This gives you energy. If your body does not make enough insulin, sugars cannot get into cells. This causes high blood sugar (hyperglycemia). Your doctor will set treatment goals for you. Generally, you should have these blood sugar levels:  Before meals (preprandial): 80-130 mg/dL (4.4-7.2 mmol/L).  After meals (postprandial): below 180 mg/dL (10 mmol/L).  A1c (hemoglobin A1c) level: less than 7%. Follow these instructions at home: Questions to ask your doctor  You may want to ask these questions: ? Do I need to meet with a diabetes educator? ? Where can I find a support group for people with diabetes? ? What equipment will I need to care for myself at home? ? What diabetes medicines do I need? When should I take them? ? How often do I need to check my blood sugar? ? What number can I call if I have questions? ? When is my next doctor's visit? General instructions  Take over-the-counter and prescription medicines only as told by your doctor.  Keep all follow-up visits as told by your doctor. This is important. Contact a doctor if:  Your blood sugar is at or above 240 mg/dL (13.3 mmol/L) for 2 days in a row.  You have been sick for 2 days or more, and you are not getting better.  You have had a fever for 2 days or more, and you are not getting better.  You have any of these problems for more than 6 hours: ? You cannot eat or drink. ? You feel sick to your stomach (nauseous). ? You throw up (vomit). ? You have watery poop (diarrhea). Get help right away if:  Your blood sugar is lower than 54 mg/dL (3 mmol/L).  You get confused.  You have  trouble: ? Thinking clearly. ? Breathing.  You have moderate or large ketone levels in your pee (urine). Summary  Type 1 diabetes (type 1 diabetes mellitus) is a long-term disease. It happens when your pancreas does not make enough of a hormone called insulin.  Your doctor will set treatment goals for you.  Take over-the-counter and prescription medicines only as told by your doctor.  Keep all follow-up visits as told by your doctor. This is important. This information is not intended to replace advice given to you by your health care provider. Make sure you discuss any questions you have with your health care provider. Document  Revised: 06/01/2017 Document Reviewed: 07/23/2015 Elsevier Patient Education  [place].

## 2019-07-22 LAB — CULTURE, BLOOD (ROUTINE X 2)
Culture: NO GROWTH
Culture: NO GROWTH
Special Requests: ADEQUATE

## 2019-07-27 NOTE — Progress Notes (Addendum)
Subjective:    Patient ID: Nathan Allen., male    DOB: 1995/10/16, 24 y.o.   MRN: [id number]  This is a 24 year old male with type 1 diabetes recently moved to [place] from [place] who had presented with 3 days of shortness of breath nausea and vomiting abdominal pain on January 13 found to be in diabetic ketoacidosis.  He ruled out for Covid infection.  He did have leukocytosis however all of his culture data were negative.  Note the patient's mother was on the phone and on speaker during this appointment and the patient agreed to that  Patient was given IV fluids and insulin and was found to be in acute renal failure but note his renal function improved and resolved at time of discharge.  This is a transitional care follow-up post hospital visit.  Below is the discharge summary  Note this patient was discharged on 17 January and a nurse case manager note and telephone visit has not yet occurred, therefore this will be billed as a regular new patient visit   Admit date: 07/16/2019 Discharge date: 07/20/2019  Time spent: 35 minutes   Recommendations for Outpatient Follow-up:  1. Follow-up with your primary care provider or endocrinologist 2. Take your medications as prescribed  Discharge Diagnoses:  Active Hospital Problems  Diagnosis Date Noted . DKA (diabetic ketoacidoses) ([place]) 07/16/2019 . Suspected COVID-19 virus infection 07/16/2019 . Hyponatremia 07/16/2019 . ARF (acute renal failure) ([place]) 07/16/2019 . Leukocytosis 07/16/2019  Resolved Hospital Problems No resolved problems to display.   Discharge Condition: Stable  Diet recommendation: Carb modified diet.  Vitals:  07/19/19 2151 07/20/19 0543 BP: 126/85 123/80 Pulse: 93 94 Resp: 16 18 Temp: 98.9 F (37.2 C) 98.6 F (37 C) SpO2: 99% 99%   History of present illness:  24 year old gentleman with history of type 1 diabetes presented to the emergency room with at least 3 days of shortness  of breath, nausea, vomiting abdominal pain and body ache. He had an exposure to COVID-19 person about a week prior to presentation and he thought he possibly had COVID-19. COVID-19 screening test negative on 07/16/2019.  According the patient, he gets mail orders from [place] for his insulin. No local healthcare provider.  Presented with leukocytosis with WBC 31K, tachycardia with heart rate 126, tachypnea with respiration rate of 26, pseudohyponatremia with sodium of 127 and serum glucose in the 460s, significant metabolic acidosis with pH of 6.9, anion gap too high to calculate, ketonuria, elevated creatinine with normal baseline.  Patient admitted for DKA type I.  Completed DKA protocol and was transitioned to long-acting and short acting insulin.  Leukocytosis, AKI and metabolic acidosis resolved post treatment.  Developed hypokalemia as a result of insulin drip.  Being repleted.  Also had isolated hyperbilirubinemia which is trending down.  Repeat CMP on Friday, 07/25/2019 at PCPs office to reassess hypokalemia and hyperbilirubinemia.  07/20/19: Seen and examined.  Vital signs and labs reviewed and are stable.  He has no new complaints.  Denies any abdominal pain or nausea.  Tolerating a diet well without any new complaints.  Updated patient's mother on the phone on 07/20/2019.  She requested printed prescription for mail order.  On the day of discharge, the patient was hemodynamically stable.  He will need to follow-up with his primary care provider or endocrinologist posthospitalization.  He will also need to take his medications as prescribed.  Hospital Course:  Principal Problem:   DKA (diabetic ketoacidoses) ([place]) Active Problems:   Suspected COVID-19  virus infection   Hyponatremia   ARF (acute renal failure) (HCC)   Leukocytosis  Resolved diabetic ketoacidosis type I: With history of uncontrolled type 1 diabetes with hyperglycemia. Completed DKA protocol. Continue Lantus 7 units twice  daily and insulin sliding scale according to carb count Strongly recommend PCP or endocrinology follow-up. Hemoglobin A1c 12.3 on 07/17/2019.  Hypokalemia likely in the setting of insulin drip Potassium 3.2, repleted with p.o. KCl and K-Phos x5 days Repeat chemistry panel on Friday, 07/25/2019 at PCPs office  Isolated hyperbilirubinemia Tbilirubin trending down 1.5 on 07/20/19, peaked at 2.2 on 07/19/19 Alkaline phosphatase, AST, ALT normal No abdominal pain, no jaundice, asymptomatic Repeat CMP on Friday 07/25/19 at PCP's office  Resolved hypophosphatemia Continue replacement x5 days Follow-up with your PCP  Resolved high anion gap metabolic acidosis in the setting of DKA type I Resolved post treatment of DKA  Uncontrolled type 1 diabetes with hyperglycemia Management as stated above  Resolved sinus tachycardia in the setting of dehydration from DKA Treated with IV fluid hydration TSH normal Heart rate back in normal range  Resolved leukocytosis: Likely reactive in the setting of DKA.  Resolved hyponatremia:Pseudohyponatremia.   Suspected COVID-19 infection: Ruled out with PCR. No evidence of infection.  Resolved acute renal failurelikely prerenal secondary to dehydration from PTW:[id number] fluid resuscitation.Renal function is back to baseline.   Since discharge the patient does now have insulin and insulin testing supplies.  He is on the 7 units of Lantus twice daily and sliding scale short acting NovoLog based on carb counting  The patient showed his meter to me and he ranges anywhere from 150 to as high as 400 on his blood sugars since discharge.  Note he was diagnosed with type 1 diabetes in 2014.  There is also family history of diabetes.  He denies alcohol use but does use marijuana.  He is now trying to eat a more healthy diet at home.  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  He has apartment of his  own.    Past Medical History:  Diagnosis Date  . Diabetes mellitus without complication ([place])   . DKA (diabetic ketoacidoses) (H[facility])      Family History  Problem Relation Age of Onset  . Healthy Mother   . Healthy Father      Social History   Socioeconomic History  . Marital status: Single    Spouse name: Not on file  . Number of children: Not on file  . Years of education: Not on file  . Highest education level: Not on file  Occupational History  . Not on file  Tobacco Use  . Smoking status: Never Smoker  . Smokeless tobacco: Never Used  Substance and Sexual Activity  . Alcohol use: Never  . Drug use: Yes    Types: Marijuana  . Sexual activity: Not on file  Other Topics Concern  . Not on file  Social History Narrative  . Not on file   Social Determinants of Health   Financial Resource Strain:   . Difficulty of Paying Living Expenses: Not on file  Food Insecurity:   . Worried About Charity fundraiser in the Last Year: Not on file  . Ran Out of Food in the Last Year: Not on file  Transportation Needs:   . Lack of Transportation (Medical): Not on file  . Lack of Transportation (Non-Medical): Not on file  Physical Activity:   . Days of  Exercise per Week: Not on file  . Minutes of Exercise per Session: Not on file  Stress:   . Feeling of Stress : Not on file  Social Connections:   . Frequency of Communication with Friends and Family: Not on file  . Frequency of Social Gatherings with Friends and Family: Not on file  . Attends Religious Services: Not on file  . Active Member of Clubs or Organizations: Not on file  . Attends Archivist Meetings: Not on file  . Marital Status: Not on file  Intimate Partner Violence:   . Fear of Current or Ex-Partner: Not on file  . Emotionally Abused: Not on file  . Physically Abused: Not on file  . Sexually Abused: Not on file     No Known Allergies   Outpatient Medications Prior to Visit  Medication Sig  Dispense Refill  . glucose blood (FREESTYLE LITE) test strip 1 each by Other route as needed for other. 100 each 5  . insulin aspart (NOVOLOG) 100 UNIT/ML FlexPen 3-23 units as per carb count and sliding scale. 15 mL 0  . insulin glargine (LANTUS) 100 unit/mL SOPN Inject 0.07 mLs (7 Units total) into the skin 2 (two) times daily. 15 mL 0  . Lancets (FREESTYLE) lancets 1 each by Other route as needed for other. 100 each 5  . potassium chloride SA (KLOR-CON) 10 MEQ tablet Take 1 tablet (10 mEq total) by mouth daily for 5 days. 5 tablet 0   No facility-administered medications prior to visit.    Review of Systems Constitutional:   No  weight loss, night sweats,  Fevers, chills, fatigue, lassitude. HEENT:   No headaches,  Difficulty swallowing,  Tooth/dental problems,  Sore throat,                No sneezing, itching, ear ache, nasal congestion, post nasal drip,   CV:  No chest pain,  Orthopnea, PND, swelling in lower extremities, anasarca, dizziness, palpitations  GI  No heartburn, indigestion, abdominal pain, nausea, vomiting, diarrhea, change in bowel habits, loss of appetite  Resp: No shortness of breath with exertion or at rest.  No excess mucus, no productive cough,  No non-productive cough,  No coughing up of blood.  No change in color of mucus.  No wheezing.  No chest wall deformity  Skin: no rash or lesions.  GU: no dysuria, change in color of urine, no urgency or frequency.  No flank pain.  MS:  No joint pain or swelling.  No decreased range of motion.  No back pain.  Psych:  No change in mood or affect. No depression or anxiety.  No memory loss.     Objective:   Physical Exam Vitals:   07/28/19 1047  BP: 100/63  Pulse: 85  SpO2: 99%  Weight: 134 lb (60.8 kg)  Height: '5\' 10"'  (1.778 m)    Gen: Pleasant, thin, in no distress,  normal affect  ENT: No lesions,  mouth clear,  oropharynx clear, no postnasal drip  Neck: No JVD, no TMG, no carotid bruits  Lungs: No use of  accessory muscles, no dullness to percussion, clear without rales or rhonchi  Cardiovascular: RRR, heart sounds normal, no murmur or gallops, no peripheral edema  Abdomen: soft and NT, no HSM,  BS normal  Musculoskeletal: No deformities, no cyanosis or clubbing  Neuro: alert, non focal  Skin: Warm, no lesions or rashes FOOT EXAM NORMAL,  No sensory deficits   CBG today was 202  Hepatic Function Latest Ref Rng & Units 07/20/2019 07/19/2019 07/18/2019  Total Protein 6.5 - 8.1 g/dL 5.6(L) 6.0(L) 6.7  Albumin 3.5 - 5.0 g/dL 3.6 3.7 4.1  AST 15 - 41 U/L 10(L) 11(L) 14(L)  ALT 0 - 44 U/L '9 9 12  ' Alk Phosphatase 38 - 126 U/L 42 50 53  Total Bilirubin 0.3 - 1.2 mg/dL 1.5(H) 2.2(H) 1.6(H)   BMP Latest Ref Rng & Units 07/20/2019 07/19/2019 07/19/2019  Glucose 70 - 99 mg/dL 162(H) 225(H) 110(H)  BUN 6 - 20 mg/dL '9 11 10  ' Creatinine 0.61 - 1.24 mg/dL 0.51(L) 0.60(L) 0.59(L)  Sodium 135 - 145 mmol/L 140 140 139  Potassium 3.5 - 5.1 mmol/L 3.2(L) 3.3(L) 2.8(L)  Chloride 98 - 111 mmol/L 102 102 103  CO2 22 - 32 mmol/L '30 26 26  ' Calcium 8.9 - 10.3 mg/dL 8.8(L) 9.0 8.9   CBC Latest Ref Rng & Units 07/19/2019 07/18/2019 07/17/2019  WBC 4.0 - 10.5 K/uL 7.9 12.5(H) 16.9(H)  Hemoglobin 13.0 - 17.0 g/dL 14.0 15.3 15.0  Hematocrit 39.0 - 52.0 % 39.3 43.1 41.7  Platelets 150 - 400 K/uL 240 281 287       Assessment & Plan:  I personally reviewed all images and lab data in the [facility] system as well as any outside material available during this office visit and agree with the  radiology impressions.   Type 1 diabetes mellitus with hyperglycemia (HCC) Type 1 diabetes with elevated hemoglobin A1c and poor glycemic control in part due to nonadherence to patient's regimen because of lack of access to insulin due to cost  Plan will be to increase Lantus to 24 units daily and continue the NovoLog per sliding scale anywhere from 3 to 23 units per carb count and sliding scale values  We will follow-up metabolic  panel blood count and lipid panel at this visit  Referral to endocrinology will be made  ARF (acute renal failure) ([place]) Renal function was improving at the end of the visit we will follow-up with a repeat metabolic panel  Hyponatremia Hyponatremia was resolved we will follow-up another metabolic panel at this visit  Leukocytosis Leukocytosis is improving and all cultures were negative from the hospitalization  We will follow-up with repeat CBC   Deward was seen today for hospitalization follow-up.  Diagnoses and all orders for this visit:  Type 1 diabetes mellitus with hyperglycemia ([place]) -     Comprehensive metabolic panel -     CBC with Differential/Platelet; Future -     Lipid panel -     Microalbumin, urine -     Ambulatory referral to Endocrinology  Diabetic ketoacidosis without coma associated with type 1 diabetes mellitus ([place]) -     POCT glucose (manual entry) -     Comprehensive metabolic panel -     CBC with Differential/Platelet; Future -     Lipid panel -     Microalbumin, urine  Hyponatremia  Acute renal failure, unspecified acute renal failure type (HCC)  Bandemia  Other orders -     insulin glargine (LANTUS) 100 unit/mL SOPN; Inject 0.24 mLs (24 Units total) into the skin at bedtime. -     insulin aspart (NOVOLOG) 100 UNIT/ML FlexPen; 3-23 units as per carb count and sliding scale. -     glucose blood (FREESTYLE LITE) test strip; 1 each by Other route as needed for other. -     Lancets (FREESTYLE) lancets; 1 each by Other route as needed for other.  Note a Pneumovax and flu vaccine was given at this visit note he had a tetanus vaccine in 2015 timeframe   Medical decision making was complex in this patient due to his renal failure leukocytosis and type 1 diabetes with DKA  I spent 30 minutes reviewing the patient's records and conferring with our clinical pharmacist on the appropriate treatment plan  We will also arrange consultation with  endocrinology as well  I also spent time educating the patient and also taken to the patient's mother by phone   An ophthalmology referral was also made

## 2019-07-28 ENCOUNTER — Ambulatory Visit: Attending: Critical Care Medicine | Admitting: Critical Care Medicine

## 2019-07-28 ENCOUNTER — Other Ambulatory Visit: Payer: Self-pay | Admitting: Pharmacist

## 2019-07-28 ENCOUNTER — Other Ambulatory Visit: Payer: Self-pay

## 2019-07-28 ENCOUNTER — Encounter: Payer: Self-pay | Admitting: Critical Care Medicine

## 2019-07-28 VITALS — BP 100/63 | HR 85 | Ht 70.0 in | Wt 134.0 lb

## 2019-07-28 DIAGNOSIS — E1065 Type 1 diabetes mellitus with hyperglycemia: Secondary | ICD-10-CM | POA: Insufficient documentation

## 2019-07-28 DIAGNOSIS — E1010 Type 1 diabetes mellitus with ketoacidosis without coma: Secondary | ICD-10-CM | POA: Diagnosis not present

## 2019-07-28 DIAGNOSIS — E871 Hypo-osmolality and hyponatremia: Secondary | ICD-10-CM | POA: Diagnosis not present

## 2019-07-28 DIAGNOSIS — N179 Acute kidney failure, unspecified: Secondary | ICD-10-CM | POA: Diagnosis not present

## 2019-07-28 DIAGNOSIS — D72825 Bandemia: Secondary | ICD-10-CM

## 2019-07-28 LAB — GLUCOSE, POCT (MANUAL RESULT ENTRY): POC Glucose: 202 mg/dl — AB (ref 70–99)

## 2019-07-28 MED ORDER — FREESTYLE LANCETS MISC: 1.0000 | 5 refills | Status: DC | PRN

## 2019-07-28 MED ORDER — FREESTYLE LITE TEST VI STRP: 1.0000 | ORAL_STRIP | 5 refills | Status: DC | PRN

## 2019-07-28 MED ORDER — INSULIN GLARGINE 100 UNITS/ML SOLOSTAR PEN: 24.0000 [IU] | PEN_INJECTOR | Freq: Every day | SUBCUTANEOUS | 3 refills | Status: DC

## 2019-07-28 MED ORDER — INSULIN ASPART 100 UNIT/ML FLEXPEN: PEN_INJECTOR | SUBCUTANEOUS | 0 refills | Status: DC

## 2019-07-28 MED ORDER — TRUE METRIX METER W/DEVICE KIT: PACK | 0 refills | Status: DC

## 2019-07-28 NOTE — Addendum Note (Signed)
Addended by: Storm Frisk on: 07/28/2019 03:37 PM   Modules accepted: Level of Service

## 2019-07-28 NOTE — Assessment & Plan Note (Signed)
Renal function was improving at the end of the visit we will follow-up with a repeat metabolic panel

## 2019-07-28 NOTE — Patient Instructions (Addendum)
Change Lantus to 24 units daily take this at bedtime  Continue your sliding scale insulin per your carb count  Refills on your insulin was sent to our pharmacy  I will make a referral to endocrinology  We have given you a list of ophthalmology resources  You will be given a flu vaccine and a pneumonia vaccine at this visit  Labs today we will check your kidney liver function blood counts and protein in your urine   Follow-up appointment with Franky Macho our clinical pharmacist remade in 1 week and he will return to see Dr. Delford Field in 1 month

## 2019-07-28 NOTE — Assessment & Plan Note (Signed)
Leukocytosis is improving and all cultures were negative from the hospitalization  We will follow-up with repeat CBC

## 2019-07-28 NOTE — Assessment & Plan Note (Signed)
Type 1 diabetes with elevated hemoglobin A1c and poor glycemic control in part due to nonadherence to patient's regimen because of lack of access to insulin due to cost  Plan will be to increase Lantus to 24 units daily and continue the NovoLog per sliding scale anywhere from 3 to 23 units per carb count and sliding scale values  We will follow-up metabolic panel blood count and lipid panel at this visit  Referral to endocrinology will be made

## 2019-07-28 NOTE — Assessment & Plan Note (Signed)
Hyponatremia was resolved we will follow-up another metabolic panel at this visit

## 2019-07-29 LAB — COMPREHENSIVE METABOLIC PANEL
ALT: 13 IU/L (ref 0–44)
AST: 21 IU/L (ref 0–40)
Albumin/Globulin Ratio: 2.5 — ABNORMAL HIGH (ref 1.2–2.2)
Albumin: 4.2 g/dL (ref 4.1–5.2)
Alkaline Phosphatase: 54 IU/L (ref 39–117)
BUN/Creatinine Ratio: 11 (ref 9–20)
BUN: 8 mg/dL (ref 6–20)
Bilirubin Total: <0.2 mg/dL (ref 0.0–1.2)
CO2: 22 mmol/L (ref 20–29)
Calcium: 9.4 mg/dL (ref 8.7–10.2)
Chloride: 101 mmol/L (ref 96–106)
Creatinine, Ser: 0.7 mg/dL — ABNORMAL LOW (ref 0.76–1.27)
GFR calc Af Amer: 156 mL/min/{1.73_m2} (ref >59)
GFR calc non Af Amer: 135 mL/min/{1.73_m2} (ref >59)
Globulin, Total: 1.7 g/dL (ref 1.5–4.5)
Glucose: 164 mg/dL — ABNORMAL HIGH (ref 65–99)
Potassium: 4.6 mmol/L (ref 3.5–5.2)
Sodium: 136 mmol/L (ref 134–144)
Total Protein: 5.9 g/dL — ABNORMAL LOW (ref 6.0–8.5)

## 2019-07-29 LAB — LIPID PANEL
Chol/HDL Ratio: 2.9 ratio (ref 0.0–5.0)
Cholesterol, Total: 180 mg/dL (ref 100–199)
HDL: 62 mg/dL (ref >39)
LDL Chol Calc (NIH): 98 mg/dL (ref 0–99)
Triglycerides: 115 mg/dL (ref 0–149)
VLDL Cholesterol Cal: 20 mg/dL (ref 5–40)

## 2019-07-29 LAB — MICROALBUMIN, URINE: Microalbumin, Urine: 4 ug/mL

## 2019-07-30 ENCOUNTER — Encounter: Payer: Self-pay | Admitting: *Deleted

## 2019-07-30 ENCOUNTER — Telehealth: Payer: Self-pay | Admitting: *Deleted

## 2019-07-30 NOTE — Progress Notes (Signed)
Communication letter has been mailed to the patient due to multiple reach outs and no answer or returned call.

## 2019-07-30 NOTE — Telephone Encounter (Signed)
Medical Assistant left message on patient's home and cell voicemail. Voicemail states to give a call back to Ladonya Jerkins with [facility] at [phone].  

## 2019-07-30 NOTE — Telephone Encounter (Signed)
-----   Message from Storm Frisk, MD sent at 07/29/2019 12:26 PM EST ----- No answer, not able to leave VM,  can you call him and let him know kidney function is improved.  His cholesterol is normal, pls keep appt with [facility] upcoming

## 2019-08-04 ENCOUNTER — Ambulatory Visit: Payer: MEDICAID | Attending: Critical Care Medicine | Admitting: Pharmacist

## 2019-08-04 ENCOUNTER — Other Ambulatory Visit: Payer: Self-pay

## 2019-08-04 DIAGNOSIS — E1065 Type 1 diabetes mellitus with hyperglycemia: Secondary | ICD-10-CM

## 2019-08-04 NOTE — Progress Notes (Signed)
    S:    PCP: Dr. Delford Field   No chief complaint on file.  Patient arrives in good spirits.  Presents for diabetes evaluation, education, and management Patient was referred and last seen by Primary Care Provider on 07/28/2019.    Family/Social History: never smoker, denies alcohol use   Insurance coverage/medication affordability: self pay  Patient reports adherence with medications.  Current diabetes medications include: Lantus 28 units, Novolog 2-23 units per carb count  Patient reports occasional hypoglycemic events. Gives readings as low as 58. Attributes to not eating his full meal.   Patient reported dietary habits:  - Pt reports adherence to a diabetic diet   Patient-reported exercise habits:  - None    Patient denies nocturia (nighttime urination).  Patient denies neuropathy (nerve pain). Patient denies visual changes. Patient reports self foot exams.     O:  POCT: 71  Repeat: 109 after fruit cup  Lab Results  Component Value Date   HGBA1C 12.3 (H) 07/17/2019   There were no vitals filed for this visit.  Lipid Panel     Component Value Date/Time   CHOL 180 07/28/2019 1220   TRIG 115 07/28/2019 1220   HDL 62 07/28/2019 1220   CHOLHDL 2.9 07/28/2019 1220   LDLCALC 98 07/28/2019 1220   Home fasting blood sugars: reports better control; reports most readings are in the 100s  2 hour post-meal/random blood sugars: denies readings > 250.   Clinical Atherosclerotic Cardiovascular Disease (ASCVD): No  The ASCVD Risk score Denman George DC Jr., et al., 2013) failed to calculate for the following reasons:   The 2013 ASCVD risk score is only valid for ages 90 to 51    A/P: Diabetes longstanding currently uncontrolled but improved. Patient is able to verbalize appropriate hypoglycemia management plan. Patient is adherent with medication. Patient was instructed to decrease Lantus to 26 units if hypoglycemia persists despite adequate meal intake during the day. He is  amenable to doing so. -Continue current regimen for now. -Extensively discussed pathophysiology of diabetes, recommended lifestyle interventions, dietary effects on blood sugar control -Counseled on s/sx of and management of hypoglycemia -Next A1C anticipated 4/21.   Written patient instructions provided.  Total time in face to face counseling 30 minutes.   Follow up PharmD Clinic Visit 08/18/19.    Butch Penny, PharmD, CPP Clinical Pharmacist [facility] [phone]

## 2019-08-05 LAB — GLUCOSE, POCT (MANUAL RESULT ENTRY)
POC Glucose: 109 mg/dl — AB (ref 70–99)
POC Glucose: 71 mg/dl (ref 70–99)

## 2019-08-18 ENCOUNTER — Ambulatory Visit (HOSPITAL_BASED_OUTPATIENT_CLINIC_OR_DEPARTMENT_OTHER): Admitting: Pharmacist

## 2019-08-18 DIAGNOSIS — E1065 Type 1 diabetes mellitus with hyperglycemia: Secondary | ICD-10-CM | POA: Diagnosis not present

## 2019-08-18 NOTE — Progress Notes (Signed)
   S:    PCP: Dr. Delford Field   No chief complaint on file.  Patient arrives in good spirits.  Presents for diabetes evaluation, education, and management Patient was referred and last seen by Primary Care Provider on 07/28/2019.    Family/Social History: never smoker, denies alcohol use   Insurance coverage/medication affordability: self pay  Patient reports adherence with medications.  Current diabetes medications include: Lantus 26 units, Novolog 2-23 units per carb count  Patient reports occasional hypoglycemic events. 50-60.  Attributes this to not finishing his meal.   Patient reported dietary habits:  - Pt reports adherence to a diabetic diet   Patient-reported exercise habits:  - None    Patient denies nocturia (nighttime urination).  Patient denies neuropathy (nerve pain). Patient denies visual changes. Patient reports self foot exams.     O:   Lab Results  Component Value Date   HGBA1C 12.3 (H) 07/17/2019   There were no vitals filed for this visit.  Lipid Panel     Component Value Date/Time   CHOL 180 07/28/2019 1220   TRIG 115 07/28/2019 1220   HDL 62 07/28/2019 1220   CHOLHDL 2.9 07/28/2019 1220   LDLCALC 98 07/28/2019 1220   Home fasting blood sugars: 97 - 215 with most in the 100 range.  2 hour post-meal/random blood sugars: denies readings > 250.  Clinical Atherosclerotic Cardiovascular Disease (ASCVD): No  The ASCVD Risk score Denman George DC Jr., et al., 2013) failed to calculate for the following reasons:   The 2013 ASCVD risk score is only valid for ages 59 to 32    A/P: Diabetes longstanding currently uncontrolled but improved. Patient is able to verbalize appropriate hypoglycemia management plan. Patient is adherent with medication. Patient was instructed to decrease Lantus to 26 units if hypoglycemia persists despite adequate meal intake during the day. He is amenable to doing so. Will make no further changes as he establishes care this week with  Endo.  -Continue current regimen for now. -Extensively discussed pathophysiology of diabetes, recommended lifestyle interventions, dietary effects on blood sugar control -Counseled on s/sx of and management of hypoglycemia -Next A1C anticipated 4/21.   Written patient instructions provided.  Total time in face to face counseling 30 minutes.   Follow up PharmD Clinic Visit 08/18/19.    Butch Penny, PharmD, CPP Clinical Pharmacist [facility] [phone]

## 2019-08-19 ENCOUNTER — Other Ambulatory Visit: Payer: Self-pay

## 2019-08-22 ENCOUNTER — Encounter: Payer: Self-pay | Admitting: Endocrinology

## 2019-08-22 ENCOUNTER — Ambulatory Visit (INDEPENDENT_AMBULATORY_CARE_PROVIDER_SITE_OTHER): Admitting: Endocrinology

## 2019-08-22 DIAGNOSIS — E1065 Type 1 diabetes mellitus with hyperglycemia: Secondary | ICD-10-CM

## 2019-08-22 MED ORDER — INSULIN ASPART 100 UNIT/ML FLEXPEN: PEN_INJECTOR | SUBCUTANEOUS | 0 refills | Status: AC

## 2019-08-22 MED ORDER — GLUCAGON HCL (RDNA) 1 MG IJ SOLR: 1.0000 mg | Freq: Once | INTRAMUSCULAR | 5 refills | Status: DC | PRN

## 2019-08-22 MED ORDER — LANTUS SOLOSTAR 100 UNIT/ML ~~LOC~~ SOPN: 26.0000 [IU] | PEN_INJECTOR | Freq: Every day | SUBCUTANEOUS | 99 refills | Status: AC

## 2019-08-22 MED ORDER — FREESTYLE LITE TEST VI STRP: 1.0000 | ORAL_STRIP | Freq: Four times a day (QID) | 3 refills | Status: AC

## 2019-08-22 NOTE — Progress Notes (Signed)
Subjective:    Patient ID: Nathan Denmark., male    DOB: 1995-11-15, 24 y.o.   MRN: [id number]  HPI  telehealth visit today via doxy video visit.  Alternatives to telehealth are presented to this patient, and the patient agrees to the telehealth visit.   Pt is advised of the cost of the visit, and agrees to this, also.   Patient is at home, and I am at the office.   Persons attending the telehealth visit: the patient and I.   pt is referred by Dr Joya Gaskins, for diabetes.  Pt states DM was dx'ed in 2013; he is unaware of any chronic complications; he has been on insulin since dx; pt says his diet and exercise are good; he has never had pancreatitis, pancreatic surgery, severe hypoglycemia.  He had DKA last month.  Pt says this was due to missing insulin.  Since back on the insulin, pt states he feels well in general. He has weight gain.  He takes Lantus, 26 units qhs, and novolog 1 unit/10g CHO (averages approx 15-20 total units per day).  He says cbg varies from 56-250.  It is in general lowest at HS, and highest at other times.  In approx 1 month, [place] will go [place] x 2 [place].   Past Medical History:  Diagnosis Date  . Diabetes mellitus without complication ([place])   . DKA (diabetic ketoacidoses) [facility])     Past Surgical History:  Procedure Laterality Date  . ADENOIDECTOMY      Social History   Socioeconomic History  . Marital status: Single    Spouse name: Not on file  . Number of children: Not on file  . Years of education: Not on file  . Highest education level: Not on file  Occupational History  . Not on file  Tobacco Use  . Smoking status: Never Smoker  . Smokeless tobacco: Never Used  Substance and Sexual Activity  . Alcohol use: Never  . Drug use: Yes    Types: Marijuana  . Sexual activity: Not on file  Other Topics Concern  . Not on file  Social History Narrative  . Not on file   Social Determinants of Health   Financial Resource Strain:   . Difficulty of Paying  Living Expenses: Not on file  Food Insecurity:   . Worried About Charity fundraiser in the Last Year: Not on file  . Ran Out of Food in the Last Year: Not on file  Transportation Needs:   . Lack of Transportation (Medical): Not on file  . Lack of Transportation (Non-Medical): Not on file  Physical Activity:   . Days of Exercise per Week: Not on file  . Minutes of Exercise per Session: Not on file  Stress:   . Feeling of Stress : Not on file  Social Connections:   . Frequency of Communication with Friends and Family: Not on file  . Frequency of Social Gatherings with Friends and Family: Not on file  . Attends Religious Services: Not on file  . Active Member of Clubs or Organizations: Not on file  . Attends Archivist Meetings: Not on file  . Marital Status: Not on file  Intimate Partner Violence:   . Fear of Current or Ex-Partner: Not on file  . Emotionally Abused: Not on file  . Physically Abused: Not on file  . Sexually Abused: Not on file    No current outpatient medications on file prior to visit.  No current facility-administered medications on file prior to visit.    No Known Allergies  Family History  Problem Relation Age of Onset  . Healthy Mother   . Healthy Father   . Diabetes Maternal Grandmother   . Diabetes Paternal Grandmother     There were no vitals taken for this visit.   Review of Systems denies weight loss, blurry vision, chest pain, sob, n/v, urinary frequency, memory loss, and depression.      Objective:   Physical Exam   I have reviewed outside records, and summarized: Pt was noted to have elevated a1c, and referred here.  He was admitted with DKA.  Coronavirus was suspected, but test was neg.   Lab Results  Component Value Date   CREATININE 0.70 (L) 07/28/2019   BUN 8 07/28/2019   NA 136 07/28/2019   K 4.6 07/28/2019   CL 101 07/28/2019   CO2 22 07/28/2019      Assessment & Plan:  Type 1 DM, new to me Noncompliance  with medications: he may not be a candidate for ongoing multiple daily injections, but we'll continue for now, given apparent improvement. Hypoglycemia: this limits aggressiveness of glycemic control   Patient Instructions  check your blood sugar 4 times a day: before the 3 meals, and at bedtime.  also check if you have symptoms of your blood sugar being too high or too low.  please keep a record of the readings and bring it to your next appointment here (or you can bring the meter itself).  You can write it on any piece of paper.  please call us sooner if your blood sugar goes below 70, or if you have a lot of readings over 200.   Please continue the same Lantus, and: Please continue the same Novolog, except please add 1 unit to breakfast and lunch, and subtract 1 unit with supper.   I have sent a prescription to your pharmacy, for a medication called "glucagon."  This is a single-use emergency kit.  It will help you when your blood sugar is so low, you need someone else to help you.  The side-effect is nausea, so you should be turned on your side after getting this shot.  Please come back for a follow-up appointment before you leave on your trip.

## 2019-08-22 NOTE — Patient Instructions (Addendum)
check your blood sugar 4 times a day: before the 3 meals, and at bedtime.  also check if you have symptoms of your blood sugar being too high or too low.  please keep a record of the readings and bring it to your next appointment here (or you can bring the meter itself).  You can write it on any piece of paper.  please call us sooner if your blood sugar goes below 70, or if you have a lot of readings over 200.   Please continue the same Lantus, and: Please continue the same Novolog, except please add 1 unit to breakfast and lunch, and subtract 1 unit with supper.   I have sent a prescription to your pharmacy, for a medication called "glucagon."  This is a single-use emergency kit.  It will help you when your blood sugar is so low, you need someone else to help you.  The side-effect is nausea, so you should be turned on your side after getting this shot.  Please come back for a follow-up appointment before you leave on your trip.

## 2019-08-26 ENCOUNTER — Other Ambulatory Visit: Payer: Self-pay | Admitting: Endocrinology

## 2019-08-26 MED ORDER — GLUCAGON HCL (RDNA) 1 MG IJ SOLR: 1.0000 mg | Freq: Once | INTRAMUSCULAR | 5 refills | Status: AC | PRN

## 2019-09-01 NOTE — Progress Notes (Deleted)
Subjective:    Patient ID: Nathan Allen., male    DOB: 13-Jun-1996, 24 y.o.   MRN: [id number]  This is a 24 year old male with type 1 diabetes recently moved to [place] from [place] who had presented with 3 days of shortness of breath nausea and vomiting abdominal pain on January 13 found to be in diabetic ketoacidosis.  He ruled out for Covid infection.  He did have leukocytosis however all of his culture data were negative.  Note the patient's mother was on the phone and on speaker during this appointment and the patient agreed to that  Patient was given IV fluids and insulin and was found to be in acute renal failure but note his renal function improved and resolved at time of discharge.  This is a transitional care follow-up post hospital visit.  Below is the discharge summary  Note this patient was discharged on 17 January and a nurse case manager note and telephone visit has not yet occurred, therefore this will be billed as a regular new patient visit   Admit date: 07/16/2019 Discharge date: 07/20/2019  Time spent: 35 minutes   Recommendations for Outpatient Follow-up:  1. Follow-up with your primary care provider or endocrinologist 2. Take your medications as prescribed  Discharge Diagnoses:  Active Hospital Problems  Diagnosis Date Noted . DKA (diabetic ketoacidoses) ([place]) 07/16/2019 . Suspected COVID-19 virus infection 07/16/2019 . Hyponatremia 07/16/2019 . ARF (acute renal failure) ([place]) 07/16/2019 . Leukocytosis 07/16/2019  Resolved Hospital Problems No resolved problems to display.   Discharge Condition: Stable  Diet recommendation: Carb modified diet.  Vitals:  07/19/19 2151 07/20/19 0543 BP: 126/85 123/80 Pulse: 93 94 Resp: 16 18 Temp: 98.9 F (37.2 C) 98.6 F (37 C) SpO2: 99% 99%   History of present illness:  24 year old gentleman with history of type 1 diabetes presented to the emergency room with at least 3 days of shortness  of breath, nausea, vomiting abdominal pain and body ache. He had an exposure to COVID-19 person about a week prior to presentation and he thought he possibly had COVID-19. COVID-19 screening test negative on 07/16/2019.  According the patient, he gets mail orders from [place] for his insulin. No local healthcare provider.  Presented with leukocytosis with WBC 31K, tachycardia with heart rate 126, tachypnea with respiration rate of 26, pseudohyponatremia with sodium of 127 and serum glucose in the 460s, significant metabolic acidosis with pH of 6.9, anion gap too high to calculate, ketonuria, elevated creatinine with normal baseline.  Patient admitted for DKA type I.  Completed DKA protocol and was transitioned to long-acting and short acting insulin.  Leukocytosis, AKI and metabolic acidosis resolved post treatment.  Developed hypokalemia as a result of insulin drip.  Being repleted.  Also had isolated hyperbilirubinemia which is trending down.  Repeat CMP on Friday, 07/25/2019 at PCPs office to reassess hypokalemia and hyperbilirubinemia.  07/20/19: Seen and examined.  Vital signs and labs reviewed and are stable.  He has no new complaints.  Denies any abdominal pain or nausea.  Tolerating a diet well without any new complaints.  Updated patient's mother on the phone on 07/20/2019.  She requested printed prescription for mail order.  On the day of discharge, the patient was hemodynamically stable.  He will need to follow-up with his primary care provider or endocrinologist posthospitalization.  He will also need to take his medications as prescribed.  Hospital Course:  Principal Problem:   DKA (diabetic ketoacidoses) ([place]) Active Problems:   Suspected COVID-19  virus infection   Hyponatremia   ARF (acute renal failure) (HCC)   Leukocytosis  Resolved diabetic ketoacidosis type I: With history of uncontrolled type 1 diabetes with hyperglycemia. Completed DKA protocol. Continue Lantus 7 units twice  daily and insulin sliding scale according to carb count Strongly recommend PCP or endocrinology follow-up. Hemoglobin A1c 12.3 on 07/17/2019.  Hypokalemia likely in the setting of insulin drip Potassium 3.2, repleted with p.o. KCl and K-Phos x5 days Repeat chemistry panel on Friday, 07/25/2019 at PCPs office  Isolated hyperbilirubinemia Tbilirubin trending down 1.5 on 07/20/19, peaked at 2.2 on 07/19/19 Alkaline phosphatase, AST, ALT normal No abdominal pain, no jaundice, asymptomatic Repeat CMP on Friday 07/25/19 at PCP's office  Resolved hypophosphatemia Continue replacement x5 days Follow-up with your PCP  Resolved high anion gap metabolic acidosis in the setting of DKA type I Resolved post treatment of DKA  Uncontrolled type 1 diabetes with hyperglycemia Management as stated above  Resolved sinus tachycardia in the setting of dehydration from DKA Treated with IV fluid hydration TSH normal Heart rate back in normal range  Resolved leukocytosis: Likely reactive in the setting of DKA.  Resolved hyponatremia:Pseudohyponatremia.   Suspected COVID-19 infection: Ruled out with PCR. No evidence of infection.  Resolved acute renal failurelikely prerenal secondary to dehydration from GEX:[id number] fluid resuscitation.Renal function is back to baseline.   Since discharge the patient does now have insulin and insulin testing supplies.  He is on the 7 units of Lantus twice daily and sliding scale short acting NovoLog based on carb counting  The patient showed his meter to me and he ranges anywhere from 150 to as high as 400 on his blood sugars since discharge.  Note he was diagnosed with type 1 diabetes in 2014.  There is also family history of diabetes.  He denies alcohol use but does use marijuana.  He is now trying to eat a more healthy diet at home.  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  He has apartment of his  own.  09/01/2019 Type 1 diabetes mellitus with hyperglycemia (HCC) Type 1 diabetes with elevated hemoglobin A1c and poor glycemic control in part due to nonadherence to patient's regimen because of lack of access to insulin due to cost  Plan will be to increase Lantus to 24 units daily and continue the NovoLog per sliding scale anywhere from 3 to 23 units per carb count and sliding scale values  We will follow-up metabolic panel blood count and lipid panel at this visit  Referral to endocrinology will be made  ARF (acute renal failure) ([place]) Renal function was improving at the end of the visit we will follow-up with a repeat metabolic panel  Hyponatremia Hyponatremia was resolved we will follow-up another metabolic panel at this visit  Leukocytosis Leukocytosis is improving and all cultures were negative from the hospitalization  We will follow-up with repeat CBC   Rain was seen today for hospitalization follow-up.  Diagnoses and all orders for this visit:  Type 1 diabetes mellitus with hyperglycemia ([place]) -     Comprehensive metabolic panel -     CBC with Differential/Platelet; Future -     Lipid panel -     Microalbumin, urine -     Ambulatory referral to Endocrinology  Diabetic ketoacidosis without coma associated with type 1 diabetes mellitus ([place]) -     POCT glucose (manual entry) -     Comprehensive metabolic panel -  CBC with Differential/Platelet; Future -     Lipid panel -     Microalbumin, urine  Hyponatremia  Acute renal failure, unspecified acute renal failure type (HCC)  Bandemia  Other orders -     insulin glargine (LANTUS) 100 unit/mL SOPN; Inject 0.24 mLs (24 Units total) into the skin at bedtime. -     insulin aspart (NOVOLOG) 100 UNIT/ML FlexPen; 3-23 units as per carb count and sliding scale. -     glucose blood (FREESTYLE LITE) test strip; 1 each by Other route as needed for other. -     Lancets (FREESTYLE) lancets; 1 each by Other route as  needed for other.  Note a Pneumovax and flu vaccine was given at this visit note he had a tetanus vaccine in 2015 timeframe   Medical decision making was complex in this patient due to his renal failure leukocytosis and type 1 diabetes with DKA  I spent 30 minutes reviewing the patient's records and conferring with our clinical pharmacist on the appropriate treatment plan  We will also arrange consultation with endocrinology as well  I also spent time educating the patient and also taken to the patient's mother by phone     Past Medical History:  Diagnosis Date  . Diabetes mellitus without complication ([place])   . DKA (diabetic ketoacidoses) ([facility])      Family History  Problem Relation Age of Onset  . Healthy Mother   . Healthy Father   . Diabetes Maternal Grandmother   . Diabetes Paternal Grandmother      Social History   Socioeconomic History  . Marital status: Single    Spouse name: Not on file  . Number of children: Not on file  . Years of education: Not on file  . Highest education level: Not on file  Occupational History  . Not on file  Tobacco Use  . Smoking status: Never Smoker  . Smokeless tobacco: Never Used  Substance and Sexual Activity  . Alcohol use: Never  . Drug use: Yes    Types: Marijuana  . Sexual activity: Not on file  Other Topics Concern  . Not on file  Social History Narrative  . Not on file   Social Determinants of Health   Financial Resource Strain:   . Difficulty of Paying Living Expenses: Not on file  Food Insecurity:   . Worried About Charity fundraiser in the Last Year: Not on file  . Ran Out of Food in the Last Year: Not on file  Transportation Needs:   . Lack of Transportation (Medical): Not on file  . Lack of Transportation (Non-Medical): Not on file  Physical Activity:   . Days of Exercise per Week: Not on file  . Minutes of Exercise per Session: Not on file  Stress:   . Feeling of Stress : Not on file  Social  Connections:   . Frequency of Communication with Friends and Family: Not on file  . Frequency of Social Gatherings with Friends and Family: Not on file  . Attends Religious Services: Not on file  . Active Member of Clubs or Organizations: Not on file  . Attends Archivist Meetings: Not on file  . Marital Status: Not on file  Intimate Partner Violence:   . Fear of Current or Ex-Partner: Not on file  . Emotionally Abused: Not on file  . Physically Abused: Not on file  . Sexually Abused: Not on file     No Known Allergies  Outpatient Medications Prior to Visit  Medication Sig Dispense Refill  . glucagon (GLUCAGEN) 1 MG SOLR injection Inject 1 mg into the muscle once as needed for up to 1 dose for low blood sugar. 1 mg 5  . glucose blood (FREESTYLE LITE) test strip 1 each by Other route 4 (four) times daily. And lancets 4/day 360 each 3  . insulin aspart (NOVOLOG) 100 UNIT/ML FlexPen 1 unit/10 grams carbohydrate, for a total of 25 units per day, and pen needles 4/day 15 mL 0  . Insulin Glargine (LANTUS SOLOSTAR) 100 UNIT/ML Solostar Pen Inject 26 Units into the skin at bedtime. 10 pen PRN   No facility-administered medications prior to visit.    Review of Systems Constitutional:   No  weight loss, night sweats,  Fevers, chills, fatigue, lassitude. HEENT:   No headaches,  Difficulty swallowing,  Tooth/dental problems,  Sore throat,                No sneezing, itching, ear ache, nasal congestion, post nasal drip,   CV:  No chest pain,  Orthopnea, PND, swelling in lower extremities, anasarca, dizziness, palpitations  GI  No heartburn, indigestion, abdominal pain, nausea, vomiting, diarrhea, change in bowel habits, loss of appetite  Resp: No shortness of breath with exertion or at rest.  No excess mucus, no productive cough,  No non-productive cough,  No coughing up of blood.  No change in color of mucus.  No wheezing.  No chest wall deformity  Skin: no rash or lesions.  GU:  no dysuria, change in color of urine, no urgency or frequency.  No flank pain.  MS:  No joint pain or swelling.  No decreased range of motion.  No back pain.  Psych:  No change in mood or affect. No depression or anxiety.  No memory loss.     Objective:   Physical Exam There were no vitals filed for this visit.  Gen: Pleasant, thin, in no distress,  normal affect  ENT: No lesions,  mouth clear,  oropharynx clear, no postnasal drip  Neck: No JVD, no TMG, no carotid bruits  Lungs: No use of accessory muscles, no dullness to percussion, clear without rales or rhonchi  Cardiovascular: RRR, heart sounds normal, no murmur or gallops, no peripheral edema  Abdomen: soft and NT, no HSM,  BS normal  Musculoskeletal: No deformities, no cyanosis or clubbing  Neuro: alert, non focal  Skin: Warm, no lesions or rashes FOOT EXAM NORMAL,  No sensory deficits   CBG today was 202  Hepatic Function Latest Ref Rng & Units 07/28/2019 07/20/2019 07/19/2019  Total Protein 6.0 - 8.5 g/dL 5.9(L) 5.6(L) 6.0(L)  Albumin 4.1 - 5.2 g/dL 4.2 3.6 3.7  AST 0 - 40 IU/L 21 10(L) 11(L)  ALT 0 - 44 IU/L '13 9 9  ' Alk Phosphatase 39 - 117 IU/L 54 42 50  Total Bilirubin 0.0 - 1.2 mg/dL <0.2 1.5(H) 2.2(H)   BMP Latest Ref Rng & Units 07/28/2019 07/20/2019 07/19/2019  Glucose 65 - 99 mg/dL 164(H) 162(H) 225(H)  BUN 6 - 20 mg/dL '8 9 11  ' Creatinine 0.76 - 1.27 mg/dL 0.70(L) 0.51(L) 0.60(L)  BUN/Creat Ratio 9 - 20 11 - -  Sodium 134 - 144 mmol/L 136 140 140  Potassium 3.5 - 5.2 mmol/L 4.6 3.2(L) 3.3(L)  Chloride 96 - 106 mmol/L 101 102 102  CO2 20 - 29 mmol/L '22 30 26  ' Calcium 8.7 - 10.2 mg/dL 9.4 8.8(L) 9.0   CBC Latest Ref  Rng & Units 07/19/2019 07/18/2019 07/17/2019  WBC 4.0 - 10.5 K/uL 7.9 12.5(H) 16.9(H)  Hemoglobin 13.0 - 17.0 g/dL 14.0 15.3 15.0  Hematocrit 39.0 - 52.0 % 39.3 43.1 41.7  Platelets 150 - 400 K/uL 240 281 287       Assessment & Plan:  I personally reviewed all images and lab data in the  [facility] system as well as any outside material available during this office visit and agree with the  radiology impressions.   No problem-specific Assessment & Plan notes found for this encounter.   There are no diagnoses linked to this encounter.Note a Pneumovax and flu vaccine was given at this visit note he had a tetanus vaccine in 2015 timeframe   Medical decision making was complex in this patient due to his renal failure leukocytosis and type 1 diabetes with DKA  I spent 30 minutes reviewing the patient's records and conferring with our clinical pharmacist on the appropriate treatment plan  We will also arrange consultation with endocrinology as well  I also spent time educating the patient and also taken to the patient's mother by phone   An ophthalmology referral was also made

## 2019-09-02 ENCOUNTER — Ambulatory Visit

## 2019-09-02 ENCOUNTER — Ambulatory Visit: Payer: Self-pay | Admitting: Critical Care Medicine

## 2020-06-26 IMAGING — DX PORTABLE CHEST - 1 VIEW
1 series · 1 of 1 positions shown · non-contrast
Comparison: None.

CLINICAL DATA: Fever, shortness of breath

EXAM:
PORTABLE CHEST 1 VIEW

[chest ap]
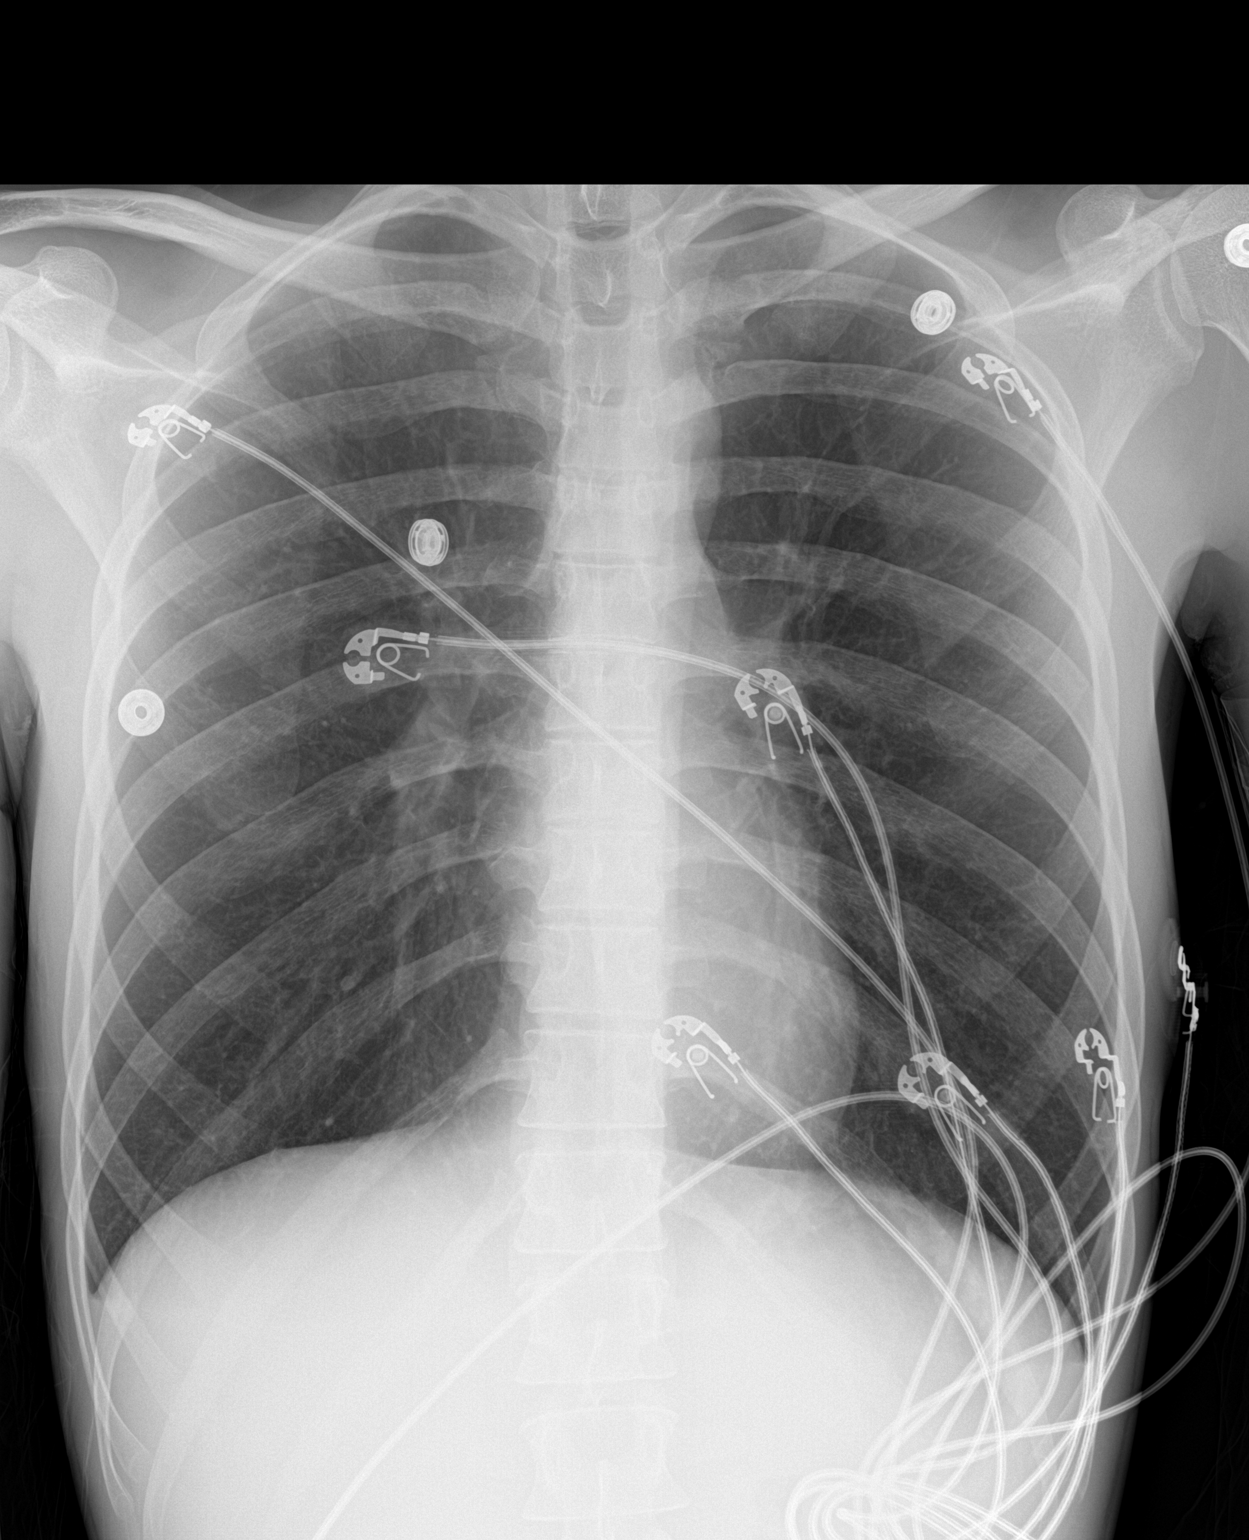

[1 of 1 positions shown; findings below may reference images not displayed]

FINDINGS: The heart size and mediastinal contours are within normal limits.
Both lungs are clear. The visualized skeletal structures are
unremarkable.
IMPRESSION: Normal chest x-ray.

## 2020-07-15 ENCOUNTER — Other Ambulatory Visit: Payer: Self-pay | Admitting: Internal Medicine

## 2020-07-15 DIAGNOSIS — E876 Hypokalemia: Secondary | ICD-10-CM
